# Patient Record
Sex: Female | Born: 1965
Health system: Southern US, Community
[De-identification: ages and names within clinical notes are randomized; demographics above are authoritative.]

## PROBLEM LIST (undated history)

## (undated) DIAGNOSIS — M1990 Unspecified osteoarthritis, unspecified site: Secondary | ICD-10-CM

## (undated) DIAGNOSIS — F419 Anxiety disorder, unspecified: Secondary | ICD-10-CM

## (undated) DIAGNOSIS — D649 Anemia, unspecified: Secondary | ICD-10-CM

## (undated) HISTORY — PX: ABDOMINAL HYSTERECTOMY: SHX81

## (undated) HISTORY — DX: Anxiety disorder, unspecified: F41.9

## (undated) HISTORY — DX: Anemia, unspecified: D64.9

## (undated) HISTORY — DX: Unspecified osteoarthritis, unspecified site: M19.90

## (undated) HISTORY — PX: OTHER SURGICAL HISTORY: SHX169

---

## 2001-02-15 ENCOUNTER — Other Ambulatory Visit: Admission: RE | Admit: 2001-02-15 | Discharge: 2001-02-15 | Payer: Self-pay | Admitting: Obstetrics and Gynecology

## 2001-10-19 ENCOUNTER — Encounter: Admission: RE | Admit: 2001-10-19 | Discharge: 2001-10-19 | Payer: Self-pay | Admitting: Obstetrics and Gynecology

## 2001-10-19 ENCOUNTER — Encounter: Payer: Self-pay | Admitting: Obstetrics and Gynecology

## 2003-12-12 ENCOUNTER — Encounter: Admission: RE | Admit: 2003-12-12 | Discharge: 2003-12-12 | Payer: Self-pay | Admitting: Obstetrics and Gynecology

## 2004-08-29 ENCOUNTER — Emergency Department (HOSPITAL_COMMUNITY): Admission: EM | Admit: 2004-08-29 | Discharge: 2004-08-29 | Payer: Self-pay | Admitting: Emergency Medicine

## 2004-08-29 ENCOUNTER — Ambulatory Visit: Payer: Self-pay | Admitting: Internal Medicine

## 2005-01-30 ENCOUNTER — Ambulatory Visit: Payer: Self-pay | Admitting: Internal Medicine

## 2006-07-27 ENCOUNTER — Ambulatory Visit: Payer: Self-pay | Admitting: Internal Medicine

## 2006-07-27 ENCOUNTER — Encounter: Payer: Self-pay | Admitting: Internal Medicine

## 2006-07-27 DIAGNOSIS — F411 Generalized anxiety disorder: Secondary | ICD-10-CM | POA: Insufficient documentation

## 2006-09-08 ENCOUNTER — Encounter: Payer: Self-pay | Admitting: Internal Medicine

## 2006-10-12 ENCOUNTER — Encounter (INDEPENDENT_AMBULATORY_CARE_PROVIDER_SITE_OTHER): Payer: Self-pay

## 2006-10-13 ENCOUNTER — Encounter: Payer: Self-pay | Admitting: Internal Medicine

## 2006-10-13 ENCOUNTER — Encounter (INDEPENDENT_AMBULATORY_CARE_PROVIDER_SITE_OTHER): Payer: Self-pay

## 2007-01-10 ENCOUNTER — Telehealth: Payer: Self-pay | Admitting: Internal Medicine

## 2007-01-11 ENCOUNTER — Telehealth: Payer: Self-pay | Admitting: Internal Medicine

## 2007-05-04 ENCOUNTER — Ambulatory Visit: Payer: Self-pay | Admitting: Internal Medicine

## 2007-05-04 LAB — CONVERTED CEMR LAB
ALT: 37 units/L — ABNORMAL HIGH (ref 0–35)
AST: 26 units/L (ref 0–37)
Albumin: 3.8 g/dL (ref 3.5–5.2)
Alkaline Phosphatase: 48 units/L (ref 39–117)
BUN: 7 mg/dL (ref 6–23)
Basophils Absolute: 0.1 10*3/uL (ref 0.0–0.1)
Basophils Relative: 0.8 % (ref 0.0–1.0)
Bilirubin Urine: NEGATIVE
Bilirubin, Direct: 0.1 mg/dL (ref 0.0–0.3)
CO2: 31 meq/L (ref 19–32)
Calcium: 9.4 mg/dL (ref 8.4–10.5)
Chloride: 104 meq/L (ref 96–112)
Cholesterol: 223 mg/dL (ref 0–200)
Creatinine, Ser: 0.7 mg/dL (ref 0.4–1.2)
Direct LDL: 159.7 mg/dL
Eosinophils Absolute: 0.2 10*3/uL (ref 0.0–0.6)
Eosinophils Relative: 2.5 % (ref 0.0–5.0)
GFR calc Af Amer: 119 mL/min
GFR calc non Af Amer: 99 mL/min
Glucose, Bld: 97 mg/dL (ref 70–99)
Glucose, Urine, Semiquant: NEGATIVE
HCT: 40.1 % (ref 36.0–46.0)
HDL: 48.4 mg/dL (ref >39.0)
Hemoglobin: 13.3 g/dL (ref 12.0–15.0)
Ketones, urine, test strip: NEGATIVE
Lymphocytes Relative: 32.9 % (ref 12.0–46.0)
MCHC: 33.1 g/dL (ref 30.0–36.0)
MCV: 87.2 fL (ref 78.0–100.0)
Monocytes Absolute: 0.4 10*3/uL (ref 0.2–0.7)
Monocytes Relative: 6.4 % (ref 3.0–11.0)
Neutro Abs: 4 10*3/uL (ref 1.4–7.7)
Neutrophils Relative %: 57.4 % (ref 43.0–77.0)
Nitrite: NEGATIVE
Platelets: 232 10*3/uL (ref 150–400)
Potassium: 4.7 meq/L (ref 3.5–5.1)
Protein, U semiquant: NEGATIVE
RBC: 4.59 M/uL (ref 3.87–5.11)
RDW: 12.1 % (ref 11.5–14.6)
Sodium: 140 meq/L (ref 135–145)
Specific Gravity, Urine: >=1.03
TSH: 0.89 microintl units/mL (ref 0.35–5.50)
Total Bilirubin: 0.6 mg/dL (ref 0.3–1.2)
Total CHOL/HDL Ratio: 4.6
Total Protein: 6.6 g/dL (ref 6.0–8.3)
Triglycerides: 122 mg/dL (ref 0–149)
Urobilinogen, UA: 1
VLDL: 24 mg/dL (ref 0–40)
WBC Urine, dipstick: NEGATIVE
WBC: 7 10*3/uL (ref 4.5–10.5)
pH: 6.5

## 2007-05-26 ENCOUNTER — Ambulatory Visit: Payer: Self-pay | Admitting: Internal Medicine

## 2007-05-26 DIAGNOSIS — F329 Major depressive disorder, single episode, unspecified: Secondary | ICD-10-CM | POA: Insufficient documentation

## 2007-07-05 ENCOUNTER — Telehealth: Payer: Self-pay | Admitting: Internal Medicine

## 2007-08-01 ENCOUNTER — Telehealth: Payer: Self-pay | Admitting: Internal Medicine

## 2007-09-02 ENCOUNTER — Telehealth: Payer: Self-pay | Admitting: Internal Medicine

## 2007-09-19 ENCOUNTER — Ambulatory Visit (HOSPITAL_COMMUNITY): Admission: RE | Admit: 2007-09-19 | Discharge: 2007-09-19 | Payer: Self-pay | Admitting: Internal Medicine

## 2007-10-24 ENCOUNTER — Telehealth: Payer: Self-pay | Admitting: Internal Medicine

## 2008-06-05 ENCOUNTER — Telehealth: Payer: Self-pay | Admitting: Internal Medicine

## 2009-01-18 ENCOUNTER — Emergency Department (HOSPITAL_COMMUNITY): Admission: EM | Admit: 2009-01-18 | Discharge: 2009-01-18 | Payer: Self-pay | Admitting: Family Medicine

## 2009-01-18 ENCOUNTER — Emergency Department (HOSPITAL_COMMUNITY): Admission: EM | Admit: 2009-01-18 | Discharge: 2009-01-18 | Payer: Self-pay | Admitting: Emergency Medicine

## 2009-07-23 ENCOUNTER — Telehealth (INDEPENDENT_AMBULATORY_CARE_PROVIDER_SITE_OTHER): Payer: Self-pay | Admitting: *Deleted

## 2009-07-24 ENCOUNTER — Ambulatory Visit: Payer: Self-pay | Admitting: Internal Medicine

## 2009-08-19 ENCOUNTER — Telehealth: Payer: Self-pay | Admitting: Internal Medicine

## 2009-09-03 ENCOUNTER — Ambulatory Visit: Payer: Self-pay | Admitting: Internal Medicine

## 2009-09-03 LAB — CONVERTED CEMR LAB
ALT: 21 units/L (ref 0–35)
AST: 18 units/L (ref 0–37)
Albumin: 4.1 g/dL (ref 3.5–5.2)
Alkaline Phosphatase: 46 units/L (ref 39–117)
BUN: 11 mg/dL (ref 6–23)
Basophils Absolute: 0 10*3/uL (ref 0.0–0.1)
Basophils Relative: 0.3 % (ref 0.0–3.0)
Bilirubin Urine: NEGATIVE
Bilirubin, Direct: 0.1 mg/dL (ref 0.0–0.3)
CO2: 28 meq/L (ref 19–32)
Calcium: 9.4 mg/dL (ref 8.4–10.5)
Chloride: 107 meq/L (ref 96–112)
Cholesterol: 222 mg/dL — ABNORMAL HIGH (ref 0–200)
Creatinine, Ser: 0.8 mg/dL (ref 0.4–1.2)
Direct LDL: 160.4 mg/dL
Eosinophils Absolute: 0.1 10*3/uL (ref 0.0–0.7)
Eosinophils Relative: 1.7 % (ref 0.0–5.0)
GFR calc non Af Amer: 84.67 mL/min (ref >60)
Glucose, Bld: 97 mg/dL (ref 70–99)
Glucose, Urine, Semiquant: NEGATIVE
HCT: 41 % (ref 36.0–46.0)
HDL: 55.3 mg/dL (ref >39.00)
Hemoglobin: 14 g/dL (ref 12.0–15.0)
Ketones, urine, test strip: NEGATIVE
Lymphocytes Relative: 29.5 % (ref 12.0–46.0)
Lymphs Abs: 2.1 10*3/uL (ref 0.7–4.0)
MCHC: 34.1 g/dL (ref 30.0–36.0)
MCV: 88.6 fL (ref 78.0–100.0)
Monocytes Absolute: 0.5 10*3/uL (ref 0.1–1.0)
Monocytes Relative: 6.9 % (ref 3.0–12.0)
Neutro Abs: 4.3 10*3/uL (ref 1.4–7.7)
Neutrophils Relative %: 61.6 % (ref 43.0–77.0)
Nitrite: NEGATIVE
Platelets: 228 10*3/uL (ref 150.0–400.0)
Potassium: 5.2 meq/L — ABNORMAL HIGH (ref 3.5–5.1)
Protein, U semiquant: NEGATIVE
RBC: 4.62 M/uL (ref 3.87–5.11)
RDW: 12.9 % (ref 11.5–14.6)
Sodium: 142 meq/L (ref 135–145)
Specific Gravity, Urine: 1.025
TSH: 0.9 microintl units/mL (ref 0.35–5.50)
Total Bilirubin: 0.7 mg/dL (ref 0.3–1.2)
Total CHOL/HDL Ratio: 4
Total Protein: 7.1 g/dL (ref 6.0–8.3)
Triglycerides: 96 mg/dL (ref 0.0–149.0)
Urobilinogen, UA: 0.2
VLDL: 19.2 mg/dL (ref 0.0–40.0)
WBC Urine, dipstick: NEGATIVE
WBC: 7.1 10*3/uL (ref 4.5–10.5)
pH: 6

## 2009-09-11 ENCOUNTER — Telehealth: Payer: Self-pay | Admitting: Internal Medicine

## 2009-09-18 ENCOUNTER — Ambulatory Visit: Payer: Self-pay | Admitting: Internal Medicine

## 2009-09-19 ENCOUNTER — Telehealth: Payer: Self-pay | Admitting: Internal Medicine

## 2010-03-27 NOTE — Progress Notes (Signed)
Summary: Call-A-Nurse Report    Call-A-Nurse Triage Call Report Triage Record Num: [id number] Operator: Jaci Carrel Patient Name: Michaela Gutierrez Call Date & Time: 07/22/2009 8:12:10AM Patient Phone: [phone] PCP: Patient Gender: Female PCP Fax : Patient DOB: 11/03/1965 Practice Name: [facility] Reason for Call: Pt calling with "panic attack." She has spoke to [facility] and she needs refill for Ambien and Ativan. Sx: heart racing, stomachache- onset 5.30 early am. Pt stopped taking Wellbutrin approx. 2 weeks ago- she is trying to "wean off" medication. She would like Ativan called in if possible. Advised pt to call office in am for refill request- advised eval today per disposition. Protocol(s) Used: Anxiety Recommended Outcome per Protocol: See Provider within 72 Hours Reason for Outcome: New or increasing symptoms AND not currently in treatment; not taking medications/therapy; or change in medication or therapy Care Advice: Continue to follow your treatment plan. Take all medications as prescribed and keep all appointments with your provider.  ~ Avoid the use of stimulants including caffeine (coffee, some soft drinks, some energy drinks, tea and chocolate), cocaine, and amphetamines. Also avoid drinking alcohol.  ~ 07/22/2009 8:25:46AM Page 1 of 1 [id number]

## 2010-03-27 NOTE — Progress Notes (Signed)
Summary: refill lorazopam  Phone Note Refill Request Message from:  Fax from Pharmacy on September 11, 2009 11:10 AM  Refills Requested: Medication #1:  LORAZEPAM 0.5 MG TABS Take 1 tablet by mouth as directed   Last Ref[facility]: [facility]/2009 [facility]    [phone]   Method Requested: Fax to Local Pharmacy Initial call taken by: Duard Brady LPN,  September 11, 2009 11:11 AM  Follow-up for Phone Call        #50  RF 2 Follow-up by: Gordy Savers  MD,  September 11, 2009 12:12 PM    Prescriptions: LORAZEPAM 0.5 MG TABS (LORAZEPAM) Take 1 tablet by mouth as directed  #50 x 2   Entered by:   Duard Brady LPN   Authorized by:   Gordy Savers  MD   Signed by:   Duard Brady LPN on 45/40/9811   Method used:   Historical   RxID:   [id number]

## 2010-03-27 NOTE — Assessment & Plan Note (Signed)
Summary: PANIC ATTACKS//CCM   Vital Signs:  Patient profile:   45 year old female Weight:      185 pounds Temp:     98.1 degrees F oral BP sitting:   120 / 80  (left arm) Cuff size:   regular  Vitals Entered By: Duard Brady LPN (July 25, 2438 8:03 AM) CC: c/o panic attacks - medication review Is Patient Diabetic? No   CC:  c/o panic attacks - medication review.  History of Present Illness: 45 year old patient who has a history of a panic disorder.  She self titrated herself off Lexapro 6 months ago.  More recently, she and self discontinued Wellbutrin, and has been off this medication for a number of weeks.  Over the weekend.  She had a severe panic attack.  She also describes a general sense of anxiety since being off   the Wellbutrin.  She states that when she was on 300 mg of Wellbutrin without Lexapro she required Ambien 4 nights out of 7 for insomnia issues.  When she wastreated with  Lexapro alone.  She felt somnolent and chronically tired  Preventive Screening-Counseling & Management  Alcohol-Tobacco     Smoking Status: never  Allergies (verified): No Known Drug Allergies  Past History:  Past Medical History: Anxiety/ panic  disorder gravida 4, para two, abortus two Depression  Physical Exam  General:  overweight-appearing.  120/80 Psych:  Cognition and judgment appear intact. Alert and cooperative with normal attention span and concentration. No apparent delusions, illusions, hallucinations   Impression & Recommendations:  Problem # 1:  ANXIETY (ICD-300.00)  The following medications were removed from the medication list:    Lexapro 10 Mg Tabs (Escitalopram oxalate) .Marland Kitchen... Take 1 tablet by mouth once a day    Lorazepam 0.5 Mg Tabs (Lorazepam) .Marland Kitchen... 1 two times a day as needed    Budeprion Xl 150 Mg Tb24 (Bupropion hcl) .Marland Kitchen... 2 every am Her updated medication list for this problem includes:    Lorazepam 0.5 Mg Tabs (Lorazepam) .Marland Kitchen... Take 1 tablet by  mouth as directed    Budeprion Xl 150 Mg Xr24h-tab (Bupropion hcl) ..... One daily  Complete Medication List: 1)  Ambien 10 Mg Tabs (Zolpidem tartrate) .... Take 1 tablet by mouth at bedtime 2)  Lorazepam 0.5 Mg Tabs (Lorazepam) .... Take 1 tablet by mouth as directed 3)  Budeprion Xl 150 Mg Xr24h-tab (Bupropion hcl) .... One daily  Patient Instructions: 1)  Please schedule a follow-up appointment in 3 months. 2)  It is important that you exercise regularly at least 20 minutes 5 times a week. If you develop chest pain, have severe difficulty breathing, or feel very tired , stop exercising immediately and seek medical attention. 3)  You need to lose weight. Consider a lower calorie diet and regular exercise.  Prescriptions: BUDEPRION XL 150 MG XR24H-TAB (BUPROPION HCL) one daily  #90 x 3   Entered and Authorized by:   Gordy Savers  MD   Signed by:   Gordy Savers  MD on 07/24/2009   Method used:   Print then Give to Patient   RxID:   [id number] LORAZEPAM 0.5 MG TABS (LORAZEPAM) Take 1 tablet by mouth as directed  #50 x 1   Entered and Authorized by:   Gordy Savers  MD   Signed by:   Gordy Savers  MD on 07/24/2009   Method used:   Print then Give to Patient   RxID:   [id number]  AMBIEN 10 MG TABS (ZOLPIDEM TARTRATE) Take 1 tablet by mouth at bedtime  #30 x 1   Entered and Authorized by:   Gordy Savers  MD   Signed by:   Gordy Savers  MD on 07/24/2009   Method used:   Print then Give to Patient   RxID:   [id number]

## 2010-03-27 NOTE — Progress Notes (Signed)
Summary: refill zolpidem  Phone Note Refill Request Message from:  Fax from Pharmacy on September 19, 2009 4:45 PM  Refills Requested: Medication #1:  AMBIEN 10 MG TABS Take 1 tablet by mouth at bedtime   Last Ref[facility]: [facility]/2009 [facility]    [phone]   Method Requested: Telephone to Pharmacy Initial call taken by: Duard Brady LPN,  September 19, 2009 4:45 PM  Follow-up for Phone Call        [facility] Follow-up by: Duard Brady LPN,  September 20, 2009 8:31 AM    Prescriptions: AMBIEN 10 MG TABS (ZOLPIDEM TARTRATE) Take 1 tablet by mouth at bedtime  #30 x 3   Entered by:   Duard Brady LPN   Authorized by:   Gordy Savers  MD   Signed by:   Duard Brady LPN on 11/91/4782   Method used:   Historical   RxID:   [id number]

## 2010-03-27 NOTE — Progress Notes (Signed)
Summary: wellbutrin and alprazolam  Phone Note Call from Patient Call back at [phone]   Caller: Patient Call For: Gordy Savers  MD Summary of Call: pt  was start on wellbutrin 150 xl on 07-24-2009 still having anxiety issue should she increase or add lexapro [facility] [phone]. Initial call taken by: Heron Sabins,  August 19, 2009 11:05 AM  Follow-up for Phone Call        increase welbutrin 150  to two daily;  Rx alpraxolam 0.5  #50 one twice  as needed Follow-up by: Gordy Savers  MD,  August 19, 2009 12:45 PM    New/Updated Medications: BUDEPRION XL 150 MG XR24H-TAB (BUPROPION HCL) bid ALPRAZOLAM 0.5 MG TABS (ALPRAZOLAM) 1 by mouth two times a day prn Prescriptions: ALPRAZOLAM 0.5 MG TABS (ALPRAZOLAM) 1 by mouth two times a day prn  #50 x 0   Entered by:   Duard Brady LPN   Authorized by:   Gordy Savers  MD   Signed by:   Duard Brady LPN on 91/47/8295   Method used:   Historical   RxID:   [id number] BUDEPRION XL 150 MG XR24H-TAB (BUPROPION HCL) bid  #60 x 6   Entered by:   Duard Brady LPN   Authorized by:   Gordy Savers  MD   Signed by:   Duard Brady LPN on 52/84/1324   Method used:   Historical   RxID:   [id number]

## 2010-03-27 NOTE — Assessment & Plan Note (Signed)
Summary: CPX/RCD   Vital Signs:  Patient profile:   45 year old female Height:      64.14 inches Weight:      180 pounds BMI:     30.87 Temp:     98.4 degrees F oral BP sitting:   118 / 80  (right arm) Cuff size:   regular  Vitals Entered By: Duard Brady LPN (September 18, 2009 8:38 AM) CC: cpx - doing ok Is Patient Diabetic? No   CC:  cpx - doing ok.  History of Present Illness: 45 year old patient who is seen today for a well exam.  She is doing quite well.  She has a history of panic disorder, anxiety, depression, and is scheduled for evaluation with [facility] later today. she is doing much better since resuming Wellbutrin.  She had been on Lexapro in the past, but when treated with a single agent had  considerable fatigue and weakness.  she is followed by gynecology every 3 years.  She is status post partial hysterectomy.  Preventive Screening-Counseling & Management  Alcohol-Tobacco     Smoking Status: never  Allergies (verified): No Known Drug Allergies  Past History:  Past Medical History: Reviewed history from 07/24/2009 and no changes required. Anxiety/ panic  disorder gravida 4, para two, abortus two Depression  Past Surgical History: Reviewed history from 05/26/2007 and no changes required. Caesarean section x 2 Hysterectomy  partial 1995.    Family History: Reviewed history from 05/26/2007 and no changes required. father is 48, history of hypertension, hypercholesterolemia mother, age 58.  Hypertension, obesity  Paternal grandfatherr lung cancer and prostate cancer paternal grandmother, single dementia of the Alzheimer's type maternal grandmother, osteoarthritis macular degeneration  Two aunts with breast cancer one brother is well  Social History: Reviewed history from 05/26/2007 and no changes required. two sons Occupation:  Charity fundraiser Married  Review of Systems       The patient complains of depression.  The patient denies anorexia,  fever, weight loss, weight gain, vision loss, decreased hearing, hoarseness, chest pain, syncope, dyspnea on exertion, peripheral edema, prolonged cough, headaches, hemoptysis, abdominal pain, melena, hematochezia, severe indigestion/heartburn, hematuria, incontinence, genital sores, muscle weakness, suspicious skin lesions, transient blindness, difficulty walking, unusual weight change, abnormal bleeding, enlarged lymph nodes, angioedema, and breast masses.    Physical Exam  General:  overweight-appearing.  low-normal blood pressureoverweight-appearing.   Head:  Normocephalic and atraumatic without obvious abnormalities. No apparent alopecia or balding. Eyes:  No corneal or conjunctival inflammation noted. EOMI. Perrla. Funduscopic exam benign, without hemorrhages, exudates or papilledema. Vision grossly normal. Ears:  External ear exam shows no significant lesions or deformities.  Otoscopic examination reveals clear canals, tympanic membranes are intact bilaterally without bulging, retraction, inflammation or discharge. Hearing is grossly normal bilaterally. Mouth:  Oral mucosa and oropharynx without lesions or exudates.  Teeth in good repair. Neck:  No deformities, masses, or tenderness noted. Chest Wall:  No deformities, masses, or tenderness noted. Lungs:  Normal respiratory effort, chest expands symmetrically. Lungs are clear to auscultation, no crackles or wheezes. Heart:  Normal rate and regular rhythm. S1 and S2 normal without gallop, murmur, click, rub or other extra sounds. Abdomen:  Bowel sounds positive,abdomen soft and non-tender without masses, organomegaly or hernias noted. Msk:  No deformity or scoliosis noted of thoracic or lumbar spine.   Pulses:  R and L carotid,radial,femoral,dorsalis pedis and posterior tibial pulses are full and equal bilaterally Extremities:  No clubbing, cyanosis, edema, or deformity noted with normal full  range of motion of all joints.   Neurologic:  No  cranial nerve deficits noted. Station and gait are normal. Plantar reflexes are down-going bilaterally. DTRs are symmetrical throughout. Sensory, motor and coordinative functions appear intact. Skin:  Intact without suspicious lesions or rashes Cervical Nodes:  No lymphadenopathy noted Axillary Nodes:  No palpable lymphadenopathy Inguinal Nodes:  No significant adenopathy Psych:  Cognition and judgment appear intact. Alert and cooperative with normal attention span and concentration. No apparent delusions, illusions, hallucinations   Impression & Recommendations:  Problem # 1:  PHYSICAL EXAMINATION (ICD-V70.0)  Complete Medication List: 1)  Ambien 10 Mg Tabs (Zolpidem tartrate) .... Take 1 tablet by mouth at bedtime 2)  Lorazepam 0.5 Mg Tabs (Lorazepam) .... Take 1 tablet by mouth as directed 3)  Budeprion Xl 150 Mg Xr24h-tab (Bupropion hcl) .... Bid 4)  Alprazolam 0.5 Mg Tabs (Alprazolam) .Marland Kitchen.. 1 by mouth two times a day prn  Patient Instructions: 1)  It is important that you exercise regularly at least 20 minutes 5 times a week. If you develop chest pain, have severe difficulty breathing, or feel very tired , stop exercising immediately and seek medical attention. 2)  You need to lose weight. Consider a lower calorie diet and regular exercise.

## 2010-05-28 LAB — CBC
HCT: 40.2 % (ref 36.0–46.0)
Hemoglobin: 13.9 g/dL (ref 12.0–15.0)
MCHC: 34.7 g/dL (ref 30.0–36.0)
MCV: 87.2 fL (ref 78.0–100.0)
Platelets: 223 10*3/uL (ref 150–400)
RBC: 4.61 MIL/uL (ref 3.87–5.11)
RDW: 12.6 % (ref 11.5–15.5)
WBC: 8.1 10*3/uL (ref 4.0–10.5)

## 2010-05-28 LAB — DIFFERENTIAL
Basophils Absolute: 0 10*3/uL (ref 0.0–0.1)
Basophils Relative: 0 % (ref 0–1)
Eosinophils Absolute: 0.2 10*3/uL (ref 0.0–0.7)
Eosinophils Relative: 2 % (ref 0–5)
Lymphocytes Relative: 33 % (ref 12–46)
Lymphs Abs: 2.6 10*3/uL (ref 0.7–4.0)
Monocytes Absolute: 0.5 10*3/uL (ref 0.1–1.0)
Monocytes Relative: 6 % (ref 3–12)
Neutro Abs: 4.7 10*3/uL (ref 1.7–7.7)
Neutrophils Relative %: 59 % (ref 43–77)

## 2010-05-28 LAB — CULTURE, ROUTINE-ABSCESS: Culture: NO GROWTH

## 2010-09-20 ENCOUNTER — Other Ambulatory Visit: Payer: Self-pay | Admitting: Internal Medicine

## 2010-12-13 ENCOUNTER — Other Ambulatory Visit: Payer: Self-pay | Admitting: Internal Medicine

## 2011-02-18 ENCOUNTER — Other Ambulatory Visit: Payer: Self-pay | Admitting: Internal Medicine

## 2011-04-05 ENCOUNTER — Other Ambulatory Visit: Payer: Self-pay | Admitting: Internal Medicine

## 2012-06-13 ENCOUNTER — Other Ambulatory Visit: Payer: Self-pay | Admitting: Internal Medicine

## 2012-06-13 DIAGNOSIS — Z1231 Encounter for screening mammogram for malignant neoplasm of breast: Secondary | ICD-10-CM

## 2012-06-15 ENCOUNTER — Ambulatory Visit (HOSPITAL_COMMUNITY): Payer: Self-pay

## 2012-06-16 ENCOUNTER — Ambulatory Visit (HOSPITAL_COMMUNITY)
Admission: RE | Admit: 2012-06-16 | Discharge: 2012-06-16 | Disposition: A | Discharge status: Home / Self Care | Payer: BC Managed Care – PPO | Source: Ambulatory Visit | Attending: Internal Medicine | Admitting: Internal Medicine

## 2012-06-16 DIAGNOSIS — Z1231 Encounter for screening mammogram for malignant neoplasm of breast: Secondary | ICD-10-CM | POA: Insufficient documentation

## 2012-06-17 ENCOUNTER — Other Ambulatory Visit: Payer: Self-pay | Admitting: Internal Medicine

## 2012-06-17 DIAGNOSIS — R928 Other abnormal and inconclusive findings on diagnostic imaging of breast: Secondary | ICD-10-CM

## 2012-06-29 ENCOUNTER — Ambulatory Visit
Admission: RE | Admit: 2012-06-29 | Discharge: 2012-06-29 | Disposition: A | Discharge status: Home / Self Care | Payer: BC Managed Care – PPO | Source: Ambulatory Visit | Attending: Internal Medicine | Admitting: Internal Medicine

## 2012-06-29 DIAGNOSIS — R928 Other abnormal and inconclusive findings on diagnostic imaging of breast: Secondary | ICD-10-CM

## 2012-10-27 ENCOUNTER — Encounter: Payer: Self-pay | Admitting: Internal Medicine

## 2012-10-27 ENCOUNTER — Ambulatory Visit (INDEPENDENT_AMBULATORY_CARE_PROVIDER_SITE_OTHER): Payer: BC Managed Care – PPO | Admitting: Internal Medicine

## 2012-10-27 VITALS — BP 160/90 | HR 110 | Temp 98.1°F | Resp 20 | Ht 63.75 in | Wt 201.0 lb

## 2012-10-27 DIAGNOSIS — M1990 Unspecified osteoarthritis, unspecified site: Secondary | ICD-10-CM

## 2012-10-27 DIAGNOSIS — M151 Heberden's nodes (with arthropathy): Secondary | ICD-10-CM | POA: Insufficient documentation

## 2012-10-27 DIAGNOSIS — M159 Polyosteoarthritis, unspecified: Secondary | ICD-10-CM

## 2012-10-27 DIAGNOSIS — Z0000 Encounter for general adult medical examination without abnormal findings: Secondary | ICD-10-CM

## 2012-10-27 MED ORDER — MELOXICAM 15 MG PO TABS: 15.0000 mg | ORAL_TABLET | Freq: Every day | ORAL | Status: DC

## 2012-10-27 NOTE — Progress Notes (Signed)
Subjective:    Patient ID: Michaela Gutierrez, female    DOB: 1965/08/21, 47 y.o.   MRN: [id number]  HPI  47 year old patient who is seen today to reestablish with our practice. 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 Her chief complaint today or painful nodules involving the PIP joints of both hands. Her mother had back osteoarthritis in her maternal  grandmother apparently had prominent Heberden's nodes. She has a history of anxiety depression and is being followed by psychiatry and apparently doing quite well.   she's had a remote hysterectomy and is complaining of some hot flashes and lack of interest in sex and is concerned about early menopause. Her mother apparently had menopause at age 74-50   Father has a history of coronary artery disease. Paternal grandfather had colon cancer Mother has osteoarthritis maternal grandmother age 45  Social history RN works at [employer] 2 sons age 22 and 75 History reviewed. No pertinent past medical history.  History   Social History  . Marital Status: Married    Spouse Name: N/A    Number of Children: N/A  . Years of Education: N/A   Occupational History  . Not on file.   Social History Main Topics  . Smoking status: Former Smoker    Types: Cigarettes  . Smokeless tobacco: Never Used  . Alcohol Use: 0.5 oz/week    1 drink(s) per week  . Drug Use: No  . Sexual Activity: Not on file   Other Topics Concern  . Not on file   Social History Narrative  . No narrative on file    History reviewed. No pertinent past surgical history.  No family history on file.  No Known Allergies  Current Outpatient Prescriptions on File Prior to Visit  Medication Sig Dispense Refill  . buPROPion (WELLBUTRIN SR) 150 MG 12 hr tablet TAKE 1 TABLET BY MOUTH TWICE A DAY  60 tablet  0   No current facility-administered medications on file prior to visit.    BP 160/90  Pulse 110  Temp(Src) 98.1 F (36.7 C) (Oral)  Resp 20  Ht 5' 3.75" (1.619  m)  Wt 201 lb (91.173 kg)  BMI 34.78 kg/m2  SpO2 95%      Review of Systems  Constitutional: Negative.   HENT: Negative for hearing loss, congestion, sore throat, rhinorrhea, dental problem, sinus pressure and tinnitus.   Eyes: Negative for pain, discharge and visual disturbance.  Respiratory: Negative for cough and shortness of breath.   Cardiovascular: Negative for chest pain, palpitations and leg swelling.  Gastrointestinal: Negative for nausea, vomiting, abdominal pain, diarrhea, constipation, blood in stool and abdominal distention.  Genitourinary: Negative for dysuria, urgency, frequency, hematuria, flank pain, vaginal bleeding, vaginal discharge, difficulty urinating, vaginal pain and pelvic pain.  Musculoskeletal: Positive for joint swelling. Negative for arthralgias and gait problem.  Skin: Negative for rash.  Neurological: Negative for dizziness, syncope, speech difficulty, weakness, numbness and headaches.  Hematological: Negative for adenopathy.  Psychiatric/Behavioral: Positive for dysphoric mood. Negative for behavioral problems and agitation. The patient is not nervous/anxious.        Objective:   Physical Exam  Constitutional: She is oriented to person, place, and time. She appears well-developed and well-nourished.  Weight 201  HENT:  Head: Normocephalic.  Right Ear: External ear normal.  Left Ear: External ear normal.  Mouth/Throat: Oropharynx is clear and moist.  Eyes: Conjunctivae and EOM are normal. Pupils are equal, round, and reactive to light.  Neck: Normal range  of motion. Neck supple. No thyromegaly present.  Cardiovascular: Normal rate, regular rhythm, normal heart sounds and intact distal pulses.   Pulmonary/Chest: Effort normal and breath sounds normal.  Abdominal: Soft. Bowel sounds are normal. She exhibits no mass. There is no tenderness.  Musculoskeletal: Normal range of motion.  Heberden's nodes involving the fingers 2 through 5 on the right  hand fingers 2 and 3 on the left. No thumb involvement  Lymphadenopathy:    She has no cervical adenopathy.  Neurological: She is alert and oriented to person, place, and time.  Skin: Skin is warm and dry. No rash noted.  Psychiatric: She has a normal mood and affect. Her behavior is normal.          Assessment & Plan:  Preventive health exam Prominent Heberden's nodes. Will treat with Tylenol as needed and give a prescription for meloxicam as needed  Laboratory update including [facility]

## 2012-10-27 NOTE — Patient Instructions (Addendum)
It is important that you exercise regularly, at least 20 minutes 3 to 4 times per week.  If you develop chest pain or shortness of breath seek  medical attention.  You need to lose weight.  Consider a lower calorie diet and regular exercise.  Tylenol 650 mg 3 times daily  Meloxicam  15  mg once daily as neededOsteoarthritis Osteoarthritis is the most common form of arthritis. It is redness, soreness, and swelling (inflammation) affecting the cartilage. Cartilage acts as a cushion, covering the ends of bones where they meet to form a joint. CAUSES  Over time, the cartilage begins to wear away. This causes bone to rub on bone. This produces pain and stiffness in the affected joints. Factors that contribute to this problem are:  Excessive body weight.  Age.  Overuse of joints. SYMPTOMS   People with osteoarthritis usually experience joint pain, swelling, or stiffness.  Over time, the joint may lose its normal shape.  Small deposits of bone (osteophytes) may grow on the edges of the joint.  Bits of bone or cartilage can break off and float inside the joint space. This may cause more pain and damage.  Osteoarthritis can lead to depression, anxiety, feelings of helplessness, and limitations on daily activities. The most commonly affected joints are in the:  Ends of the fingers.  Thumbs.  Neck.  Lower back.  Knees.  Hips. DIAGNOSIS  Diagnosis is mostly based on your symptoms and exam. Tests may be helpful, including:  X-rays of the affected joint.  A computerized magnetic scan (MRI).  Blood tests to rule out other types of arthritis.  Joint fluid tests. This involves using a needle to draw fluid from the joint and examining the fluid under a microscope. TREATMENT  Goals of treatment are to control pain, improve joint function, maintain a normal body weight, and maintain a healthy lifestyle. Treatment approaches may include:  A prescribed exercise program with rest and  joint relief.  Weight control with nutritional education.  Pain relief techniques such as:  Properly applied heat and cold.  Electric pulses delivered to nerve endings under the skin (transcutaneous electrical nerve stimulation, TENS).  Massage.  Certain supplements. Ask your caregiver before using any supplements, especially in combination with prescribed drugs.  Medicines to control pain, such as:  Acetaminophen.  Nonsteroidal anti-inflammatory drugs (NSAIDs), such as naproxen.  Narcotic or central-acting agents, such as tramadol. This drug carries a risk of addiction and is generally prescribed for short-term use.  Corticosteroids. These can be given orally or as injection. This is a short-term treatment, not recommended for routine use.  Surgery to reposition the bones and relieve pain (osteotomy) or to remove loose pieces of bone and cartilage. Joint replacement may be needed in advanced states of osteoarthritis. HOME CARE INSTRUCTIONS  Your caregiver can recommend specific types of exercise. These may include:  Strengthening exercises. These are done to strengthen the muscles that support joints affected by arthritis. They can be performed with weights or with exercise bands to add resistance.  Aerobic activities. These are exercises, such as brisk walking or low-impact aerobics, that get your heart pumping. They can help keep your lungs and circulatory system in shape.  Range-of-motion activities. These keep your joints limber.  Balance and agility exercises. These help you maintain daily living skills. Learning about your condition and being actively involved in your care will help improve the course of your osteoarthritis. SEEK MEDICAL CARE IF:   You feel hot or your skin turns  red.  You develop a rash in addition to your joint pain.  You have an oral temperature above 102 F (38.9 C). FOR MORE INFORMATION  National Institute of Arthritis and Musculoskeletal and  Skin Diseases: www.niams.http://www.myers.net/ [employer] on Aging: https://walker.com/ American College of Rheumatology: www.rheumatology.org Document Released: 02/09/2005 Document Revised: 05/04/2011 Document Reviewed: 05/23/2009 [facility] Patient Information 2014 [place], [place].

## 2012-10-28 ENCOUNTER — Other Ambulatory Visit: Payer: BC Managed Care – PPO

## 2012-12-29 ENCOUNTER — Other Ambulatory Visit: Payer: Self-pay

## 2013-12-08 ENCOUNTER — Other Ambulatory Visit: Payer: Self-pay

## 2015-02-07 ENCOUNTER — Other Ambulatory Visit: Payer: Self-pay

## 2015-02-07 DIAGNOSIS — Z1231 Encounter for screening mammogram for malignant neoplasm of breast: Secondary | ICD-10-CM

## 2015-02-08 ENCOUNTER — Ambulatory Visit (INDEPENDENT_AMBULATORY_CARE_PROVIDER_SITE_OTHER): Payer: BLUE CROSS/BLUE SHIELD | Admitting: Internal Medicine

## 2015-02-08 ENCOUNTER — Encounter: Payer: Self-pay | Admitting: Internal Medicine

## 2015-02-08 VITALS — BP 102/70 | HR 92 | Temp 98.3°F | Ht 63.75 in | Wt 200.0 lb

## 2015-02-08 DIAGNOSIS — M150 Primary generalized (osteo)arthritis: Secondary | ICD-10-CM

## 2015-02-08 DIAGNOSIS — M151 Heberden's nodes (with arthropathy): Secondary | ICD-10-CM

## 2015-02-08 DIAGNOSIS — M159 Polyosteoarthritis, unspecified: Secondary | ICD-10-CM

## 2015-02-08 MED ORDER — MELOXICAM 15 MG PO TABS: 15.0000 mg | ORAL_TABLET | Freq: Every day | ORAL | Status: DC

## 2015-02-08 NOTE — Patient Instructions (Signed)
Rheumatology follow-up as discussed  Osteoarthritis Osteoarthritis is a disease that causes soreness and inflammation of a joint. It occurs when the cartilage at the affected joint wears down. Cartilage acts as a cushion, covering the ends of bones where they meet to form a joint. Osteoarthritis is the most common form of arthritis. It often occurs in older people. The joints affected most often by this condition include those in the:  Ends of the fingers.  Thumbs.  Neck.  Lower back.  Knees.  Hips. CAUSES  Over time, the cartilage that covers the ends of bones begins to wear away. This causes bone to rub on bone, producing pain and stiffness in the affected joints.  RISK FACTORS Certain factors can increase your chances of having osteoarthritis, including:  Older age.  Excessive body weight.  Overuse of joints.  Previous joint injury. SIGNS AND SYMPTOMS   Pain, swelling, and stiffness in the joint.  Over time, the joint may lose its normal shape.  Small deposits of bone (osteophytes) may grow on the edges of the joint.  Bits of bone or cartilage can break off and float inside the joint space. This may cause more pain and damage. DIAGNOSIS  Your health care provider will do a physical exam and ask about your symptoms. Various tests may be ordered, such as:  X-rays of the affected joint.  Blood tests to rule out other types of arthritis. Additional tests may be used to diagnose your condition. TREATMENT  Goals of treatment are to control pain and improve joint function. Treatment plans may include:  A prescribed exercise program that allows for rest and joint relief.  A weight control plan.  Pain relief techniques, such as:  Properly applied heat and cold.  Electric pulses delivered to nerve endings under the skin (transcutaneous electrical nerve stimulation [TENS]).  Massage.  Certain nutritional supplements.  Medicines to control pain, such  as:  Acetaminophen.  Nonsteroidal anti-inflammatory drugs (NSAIDs), such as naproxen.  Narcotic or central-acting agents, such as tramadol.  Corticosteroids. These can be given orally or as an injection.  Surgery to reposition the bones and relieve pain (osteotomy) or to remove loose pieces of bone and cartilage. Joint replacement may be needed in advanced states of osteoarthritis. HOME CARE INSTRUCTIONS   Take medicines only as directed by your health care provider.  Maintain a healthy weight. Follow your health care provider's instructions for weight control. This may include dietary instructions.  Exercise as directed. Your health care provider can recommend specific types of exercise. These may include:  Strengthening exercises. These are done to strengthen the muscles that support joints affected by arthritis. They can be performed with weights or with exercise bands to add resistance.  Aerobic activities. These are exercises, such as brisk walking or low-impact aerobics, that get your heart pumping.  Range-of-motion activities. These keep your joints limber.  Balance and agility exercises. These help you maintain daily living skills.  Rest your affected joints as directed by your health care provider.  Keep all follow-up visits as directed by your health care provider. SEEK MEDICAL CARE IF:   Your skin turns red.  You develop a rash in addition to your joint pain.  You have worsening joint pain.  You have a fever along with joint or muscle aches. SEEK IMMEDIATE MEDICAL CARE IF:  You have a significant loss of weight or appetite.  You have night sweats. [place] of Arthritis and Musculoskeletal and Skin Diseases:  www.niams.SouthExposed.es  [employer] on Aging: http://kim-miller.com/  American College of Rheumatology: www.rheumatology.org   This information is not intended to replace advice given to you by your health care provider.  Make sure you discuss any questions you have with your health care provider.   Document Released: 02/09/2005 Document Revised: 03/02/2014 Document Reviewed: 10/17/2012 Elsevier Interactive Patient Education [employer].

## 2015-02-08 NOTE — Progress Notes (Signed)
Pre visit review using our clinic review tool, if applicable. No additional management support is needed unless otherwise documented below in the visit note. 

## 2015-02-08 NOTE — Progress Notes (Signed)
   Subjective:    Patient ID: Michaela Gutierrez, female    DOB: [birth year], 49 y.o.   MRN: [id number]  HPI  49 year old patient who has a long history of osteoarthritis with prominent Heberden's nodes.  Her grandmother had significant hand osteoarthritic disease.  The patient has now developed some PIP joint involving especially the right fourth digit.  She states that she had to recently cut her wedding band from her left fourth finger due to swelling.  She takes Advil periodically.  She has used meloxicam in the past.   No past medical history on file.  Social History   Social History  . Marital Status: Married    Spouse Name: N/A  . Number of Children: N/A  . Years of Education: N/A   Occupational History  . Not on file.   Social History Main Topics  . Smoking status: Former Smoker    Types: Cigarettes  . Smokeless tobacco: Never Used  . Alcohol Use: 0.5 oz/week    1 drink(s) per week  . Drug Use: No  . Sexual Activity: Not on file   Other Topics Concern  . Not on file   Social History Narrative    No past surgical history on file.  No family history on file.  No Known Allergies  Current Outpatient Prescriptions on File Prior to Visit  Medication Sig Dispense Refill  . buPROPion (WELLBUTRIN SR) 150 MG 12 hr tablet TAKE 1 TABLET BY MOUTH TWICE A DAY 60 tablet 0  . sertraline (ZOLOFT) 100 MG tablet Take 100 mg by mouth daily.    Marland Kitchen zolpidem (AMBIEN) 10 MG tablet Take 10 mg by mouth at bedtime as needed for sleep.     No current facility-administered medications on file prior to visit.    BP 102/70 mmHg  Pulse 92  Temp(Src) 98.3 F (36.8 C) (Oral)  Ht 5' 3.75" (1.619 m)  Wt 200 lb (90.719 kg)  BMI 34.61 kg/m2  SpO2 98%     Review of Systems  Musculoskeletal: Positive for joint swelling and arthralgias.       Objective:   Physical Exam  Constitutional: She appears well-developed and well-nourished. No distress.  Musculoskeletal:  Prominent Heberden's  nodes Soft tissue swelling of the right fourth PIP joint          Assessment & Plan:   Symptomatic osteoarthritis.  Patient is requesting rheumatology evaluation.  Will schedule.  Will refill meloxicam Patient information dispensed  Return here when necessary

## 2015-02-15 ENCOUNTER — Ambulatory Visit: Payer: Self-pay

## 2015-10-04 ENCOUNTER — Other Ambulatory Visit: Payer: Self-pay | Admitting: Internal Medicine

## 2016-05-26 DIAGNOSIS — F410 Panic disorder [episodic paroxysmal anxiety] without agoraphobia: Secondary | ICD-10-CM | POA: Diagnosis not present

## 2016-06-12 ENCOUNTER — Other Ambulatory Visit: Payer: Self-pay | Admitting: Internal Medicine

## 2016-08-31 ENCOUNTER — Telehealth: Payer: Self-pay | Admitting: Internal Medicine

## 2016-08-31 NOTE — Telephone Encounter (Signed)
referral ordered 

## 2016-08-31 NOTE — Telephone Encounter (Signed)
Please advise 

## 2016-08-31 NOTE — Telephone Encounter (Signed)
Okay for orthopedic referral 
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
## 2016-09-14 ENCOUNTER — Ambulatory Visit (INDEPENDENT_AMBULATORY_CARE_PROVIDER_SITE_OTHER): Payer: Self-pay | Admitting: Orthopaedic Surgery

## 2016-11-12 ENCOUNTER — Encounter: Payer: Self-pay | Admitting: Internal Medicine

## 2017-01-12 ENCOUNTER — Other Ambulatory Visit: Payer: Self-pay | Admitting: Internal Medicine

## 2017-01-12 DIAGNOSIS — Z1231 Encounter for screening mammogram for malignant neoplasm of breast: Secondary | ICD-10-CM

## 2017-01-19 DIAGNOSIS — F410 Panic disorder [episodic paroxysmal anxiety] without agoraphobia: Secondary | ICD-10-CM | POA: Diagnosis not present

## 2017-02-10 ENCOUNTER — Ambulatory Visit
Admission: RE | Admit: 2017-02-10 | Discharge: 2017-02-10 | Disposition: A | Discharge status: Home / Self Care | Payer: BLUE CROSS/BLUE SHIELD | Source: Ambulatory Visit | Attending: Internal Medicine | Admitting: Internal Medicine

## 2017-02-10 DIAGNOSIS — Z1231 Encounter for screening mammogram for malignant neoplasm of breast: Secondary | ICD-10-CM

## 2017-09-16 DIAGNOSIS — F410 Panic disorder [episodic paroxysmal anxiety] without agoraphobia: Secondary | ICD-10-CM | POA: Diagnosis not present

## 2018-03-11 ENCOUNTER — Ambulatory Visit: Payer: BLUE CROSS/BLUE SHIELD | Admitting: Family Medicine

## 2018-03-11 ENCOUNTER — Encounter: Payer: Self-pay | Admitting: Family Medicine

## 2018-03-11 ENCOUNTER — Ambulatory Visit (INDEPENDENT_AMBULATORY_CARE_PROVIDER_SITE_OTHER): Payer: BLUE CROSS/BLUE SHIELD

## 2018-03-11 VITALS — BP 110/76 | HR 74 | Temp 98.7°F | Ht 63.0 in | Wt 195.0 lb

## 2018-03-11 DIAGNOSIS — Z7689 Persons encountering health services in other specified circumstances: Secondary | ICD-10-CM

## 2018-03-11 DIAGNOSIS — M1990 Unspecified osteoarthritis, unspecified site: Secondary | ICD-10-CM | POA: Diagnosis not present

## 2018-03-11 DIAGNOSIS — M19041 Primary osteoarthritis, right hand: Secondary | ICD-10-CM | POA: Diagnosis not present

## 2018-03-11 NOTE — Patient Instructions (Signed)
Osteoarthritis  Osteoarthritis is a type of arthritis that affects tissue that covers the ends of bones in joints (cartilage). Cartilage acts as a cushion between the bones and helps them move smoothly. Osteoarthritis results when cartilage in the joints gets worn down. Osteoarthritis is sometimes called "wear and tear" arthritis. Osteoarthritis is the most common form of arthritis. It often occurs in older people. It is a condition that gets worse over time (a progressive condition). Joints that are most often affected by this condition are in:  Fingers.  Toes.  Hips.  Knees.  Spine, including neck and lower back. What are the causes? This condition is caused by age-related wearing down of cartilage that covers the ends of bones. What increases the risk? The following factors may make you more likely to develop this condition:  Older age.  Being overweight or obese.  Overuse of joints, such as in athletes.  Past injury of a joint.  Past surgery on a joint.  Family history of osteoarthritis. What are the signs or symptoms? The main symptoms of this condition are pain, swelling, and stiffness in the joint. The joint may lose its shape over time. Small pieces of bone or cartilage may break off and float inside of the joint, which may cause more pain and damage to the joint. Small deposits of bone (osteophytes) may grow on the edges of the joint. Other symptoms may include:  A grating or scraping feeling inside the joint when you move it.  Popping or creaking sounds when you move. Symptoms may affect one or more joints. Osteoarthritis in a major joint, such as your knee or hip, can make it painful to walk or exercise. If you have osteoarthritis in your hands, you might not be able to grip items, twist your hand, or control small movements of your hands and fingers (fine motor skills). How is this diagnosed? This condition may be diagnosed based on:  Your medical history.  A  physical exam.  Your symptoms.  X-rays of the affected joint(s).  Blood tests to rule out other types of arthritis. How is this treated? There is no cure for this condition, but treatment can help to control pain and improve joint function. Treatment plans may include:  A prescribed exercise program that allows for rest and joint relief. You may work with a physical therapist.  A weight control plan.  Pain relief techniques, such as: ? Applying heat and cold to the joint. ? Electric pulses delivered to nerve endings under the skin (transcutaneous electrical nerve stimulation, or TENS). ? Massage. ? Certain nutritional supplements.  NSAIDs or prescription medicines to help relieve pain.  Medicine to help relieve pain and inflammation (corticosteroids). This can be given by mouth (orally) or as an injection.  Assistive devices, such as a brace, wrap, splint, specialized glove, or cane.  Surgery, such as: ? An osteotomy. This is done to reposition the bones and relieve pain or to remove loose pieces of bone and cartilage. ? Joint replacement surgery. You may need this surgery if you have very bad (advanced) osteoarthritis. Follow these instructions at home: Activity  Rest your affected joints as directed by your health care provider.  Do not drive or use heavy machinery while taking prescription pain medicine.  Exercise as directed. Your health care provider or physical therapist may recommend specific types of exercise, such as: ? Strengthening exercises. These are done to strengthen the muscles that support joints that are affected by arthritis. They can be performed   with weights or with exercise bands to add resistance. ? Aerobic activities. These are exercises, such as brisk walking or water aerobics, that get your heart pumping. ? Range-of-motion activities. These keep your joints easy to move. ? Balance and agility exercises. Managing pain, stiffness, and swelling       If directed, apply heat to the affected area as often as told by your health care provider. Use the heat source that your health care provider recommends, such as a moist heat pack or a heating pad. ? If you have a removable assistive device, remove it as told by your health care provider. ? Place a towel between your skin and the heat source. If your health care provider tells you to keep the assistive device on while you apply heat, place a towel between the assistive device and the heat source. ? Leave the heat on for 20-30 minutes. ? Remove the heat if your skin turns bright red. This is especially important if you are unable to feel pain, heat, or cold. You may have a greater risk of getting burned.  If directed, put ice on the affected joint: ? If you have a removable assistive device, remove it as told by your health care provider. ? Put ice in a plastic bag. ? Place a towel between your skin and the bag. If your health care provider tells you to keep the assistive device on during icing, place a towel between the assistive device and the bag. ? Leave the ice on for 20 minutes, 2-3 times a day. General instructions  Take over-the-counter and prescription medicines only as told by your health care provider.  Maintain a healthy weight. Follow instructions from your health care provider for weight control. These may include dietary restrictions.  Do not use any products that contain nicotine or tobacco, such as cigarettes and e-cigarettes. These can delay bone healing. If you need help quitting, ask your health care provider.  Use assistive devices as directed by your health care provider.  Keep all follow-up visits as told by your health care provider. This is important. Where to find more information  [employer] of Arthritis and Musculoskeletal and Skin Diseases: www.niams.SouthExposed.es  [employer] on Aging: http://kim-miller.com/  American College of Rheumatology:  www.rheumatology.org Contact a health care provider if:  Your skin turns red.  You develop a rash.  You have pain that gets worse.  You have a fever along with joint or muscle aches. Get help right away if:  You lose a lot of weight.  You suddenly lose your appetite.  You have night sweats. Summary  Osteoarthritis is a type of arthritis that affects tissue covering the ends of bones in joints (cartilage).  This condition is caused by age-related wearing down of cartilage that covers the ends of bones.  The main symptom of this condition is pain, swelling, and stiffness in the joint.  There is no cure for this condition, but treatment can help to control pain and improve joint function. This information is not intended to replace advice given to you by your health care provider. Make sure you discuss any questions you have with your health care provider. Document Released: 02/09/2005 Document Revised: 11/16/2016 Document Reviewed: 10/14/2015 Elsevier Interactive Patient Education  2019 [place].  Rheumatoid Arthritis Rheumatoid arthritis (RA) is a long-term (chronic) disease. RA causes inflammation in your joints. Your joints may feel painful, stiff, swollen, and warm. RA may start slowly. It most often affects the small joints of  the hands and feet. It can also affect other parts of the body. Symptoms of RA often come and go. There is no cure for RA, but medicines can help your symptoms. What are the causes?  RA is an autoimmune disease. This means that your body's defense system (immune system) attacks healthy parts of your body by mistake. The exact cause of RA is not known. What increases the risk?  Being a woman.  Having a family history of RA or other diseases like RA.  Smoking.  Being overweight.  Being exposed to pollutants or chemicals. What are the signs or symptoms?  Morning stiffness that lasts longer than 30 minutes. This is often the first  symptom.  Symptoms start slowly. They are often worse in the morning.  As RA gets worse, symptoms may include: ? Pain, stiffness, swelling, warmth, and tenderness in joints on both sides of your body. ? Loss of energy. ? Not feeling hungry. ? Weight loss. ? A low fever. ? Dry eyes and a dry mouth. ? Firm lumps that grow under your skin. ? Changes in the way your joints look. ? Changes in the way your joints work.  Symptoms vary and they: ? Often come and go. ? Sometimes get worse for a period of time. These are called flares. How is this treated?   Treatment may include: ? Taking good care of yourself. Be sure to rest as needed, eat a healthy diet, and exercise. ? Medicines. These may include:  Pain relievers.  Medicines to help with inflammation.  Disease-modifying antirheumatic drugs (DMARDs).  Medicines called biologic response modifiers. ? Physical therapy and occupational therapy. ? Surgery, if joint damage is very bad. Your doctor will work with you to find the best treatments. Follow these instructions at home: Activity  Return to your normal activities as told by your doctor. Ask your doctor what activities are safe for you.  Rest when you have a flare.  Exercise as told by your doctor. General instructions  Take over-the-counter and prescription medicines only as told by your doctor.  Keep all follow-up visits as told by your doctor. This is important. Where to find more information  [employer] of Rheumatology: www.rheumatology.[place]: www.arthritis.org Contact a doctor if:  You have a flare.  You have a fever.  You have problems because of your medicines. Get help right away if:  You have chest pain.  You have trouble breathing.  You get a hot, painful joint all of a sudden, and it is worse than your normal joint aches. Summary  RA is a long-term disease.  Symptoms of RA start slowly. They are often worse in the  morning.  RA causes inflammation in your joints. This information is not intended to replace advice given to you by your health care provider. Make sure you discuss any questions you have with your health care provider. Document Released: 05/04/2011 Document Revised: 10/13/2017 Document Reviewed: 10/13/2017 Elsevier Interactive Patient Education  2019 [employer].

## 2018-03-11 NOTE — Progress Notes (Signed)
Subjective:    Patient ID: Michaela Gutierrez, female    DOB: [birth year], 53 y.o.   MRN: [id number]  No chief complaint on file.   HPI Patient was seen today for re-establish care.  Previously seen by Dr. Burnice Logan over 3 yrs ago.    Hand pain: -pt told had OA.   -seen by Rheumatology, Dr. Dora Sims in the past.  Had blood work and neck xray. -states had labs, but no imaging -endorses R hand worse than L.  Difficulty with flexion of digits of R hand. -taking ibuprofen 800 mg 3x per wk, Tylenol, heat and ice. -in the past used mobic -pt's MGM had OA -pt concerned about progression as she works in L&D as a Marine scientist.  Anxiety: -pt taking wellbutrin XL 300 mg, Zoloft 150 mg, ambien 10mg  -followed by Dr. Benard Rink -endorses good mood, decreased energy, ok sleep.   -only takes [place] if does not have to work the next day.  Past Surg Hx: Hysterectomy at in her 98s for heavy bleeding 2/2 placenta accreta   History reviewed. No pertinent past medical history.  No Known Allergies  ROS General: Denies fever, chills, night sweats, changes in weight, changes in appetite HEENT: Denies headaches, ear pain, changes in vision, rhinorrhea, sore throat CV: Denies CP, palpitations, SOB, orthopnea Pulm: Denies SOB, cough, wheezing GI: Denies abdominal pain, nausea, vomiting, diarrhea, constipation GU: Denies dysuria, hematuria, frequency, vaginal discharge Msk: Denies muscle cramps   +hand pain and deformity Neuro: Denies weakness, numbness, tingling Skin: Denies rashes, bruising Psych: Denies depression, anxiety, hallucinations    Objective:    Blood pressure 110/76, pulse 74, temperature 98.7 F (37.1 C), temperature source Oral, height 5\' 3"  (1.6 m), weight 195 lb (88.5 kg), SpO2 98 %.  Gen. Pleasant, well-nourished, in no distress, normal affect  HEENT: [place]/AT, face symmetric, no scleral icterus, PERRLA, nares patent without drainage, pharynx without erythema or exudate. Lungs: no accessory  muscle use, CTAB, no wheezes or rales Cardiovascular: RRR, no m/r/g, no peripheral edema Musculoskeletal: Mild TTP of digits of R hand.  R hand with mild edema, nodules on DIP joints, taught skin of digits between DIP and PIP joints, decreased  ROM of digits.  L thumb with decreased flexion.  No cyanosis or clubbing, normal tone Neuro:  A&Ox3, CN II-XII intact, normal gait  Wt Readings from Last 3 Encounters:  03/11/18 195 lb (88.5 kg)  02/08/15 200 lb (90.7 kg)  10/27/12 201 lb (91.2 kg)    Lab Results  Component Value Date   WBC 7.1 09/03/2009   HGB 14.0 09/03/2009   HCT 41.0 09/03/2009   PLT 228.0 09/03/2009   GLUCOSE 97 09/03/2009   CHOL 222 (H) 09/03/2009   TRIG 96.0 09/03/2009   HDL 55.30 09/03/2009   LDLDIRECT 160.4 09/03/2009   ALT 21 09/03/2009   AST 18 09/03/2009   NA 142 09/03/2009   K 5.2 (H) 09/03/2009   CL 107 09/03/2009   CREATININE 0.8 09/03/2009   BUN 11 09/03/2009   CO2 28 09/03/2009   TSH 0.90 09/03/2009    Assessment/Plan:  Arthritis  -likely OA given hx, however will obtain imaging -discussed obtaining labs to r/o RA depending of xray results. -given handouts -for now continue NSAIDs with food. -will place referral if needed. - Plan: DG Hand Complete Right  Encounter to establish care -pt re-establishing care as last seen >3 yrs ago   F/u in the next few wks for CPE.  Grier Mitts, MD

## 2018-06-02 DIAGNOSIS — F410 Panic disorder [episodic paroxysmal anxiety] without agoraphobia: Secondary | ICD-10-CM | POA: Diagnosis not present

## 2018-07-07 IMAGING — MG 2D DIGITAL SCREENING BILATERAL MAMMOGRAM WITH CAD AND ADJUNCT TO
8 of 12 series · 8 of 28 positions shown · non-contrast
Comparison: Previous exam(s).

CLINICAL DATA: Screening.

EXAM:
2D DIGITAL SCREENING BILATERAL MAMMOGRAM WITH CAD AND ADJUNCT TOMO

[L MLO]
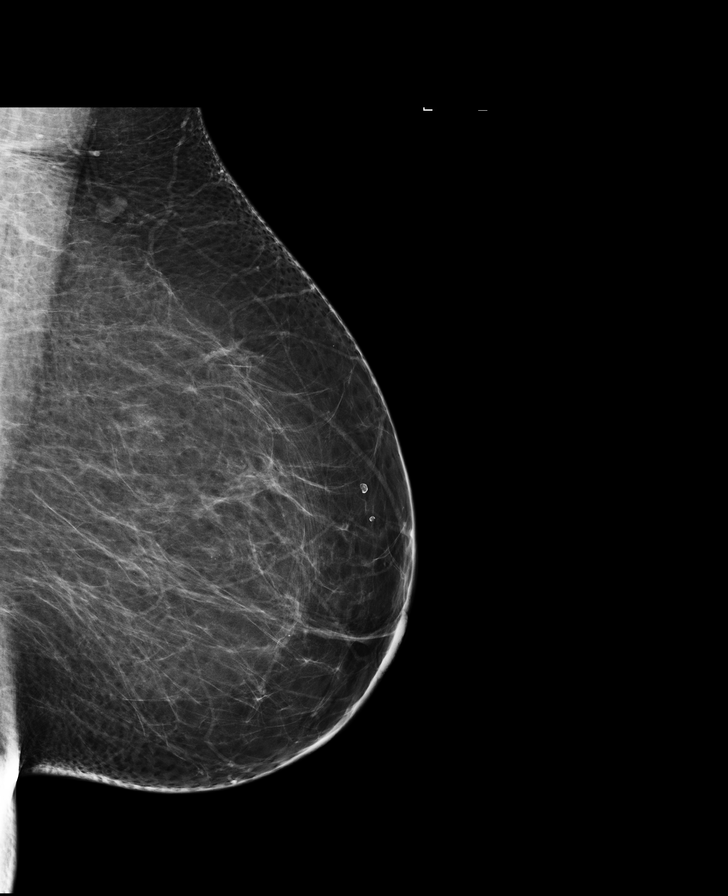

[R MLO synth-2D]
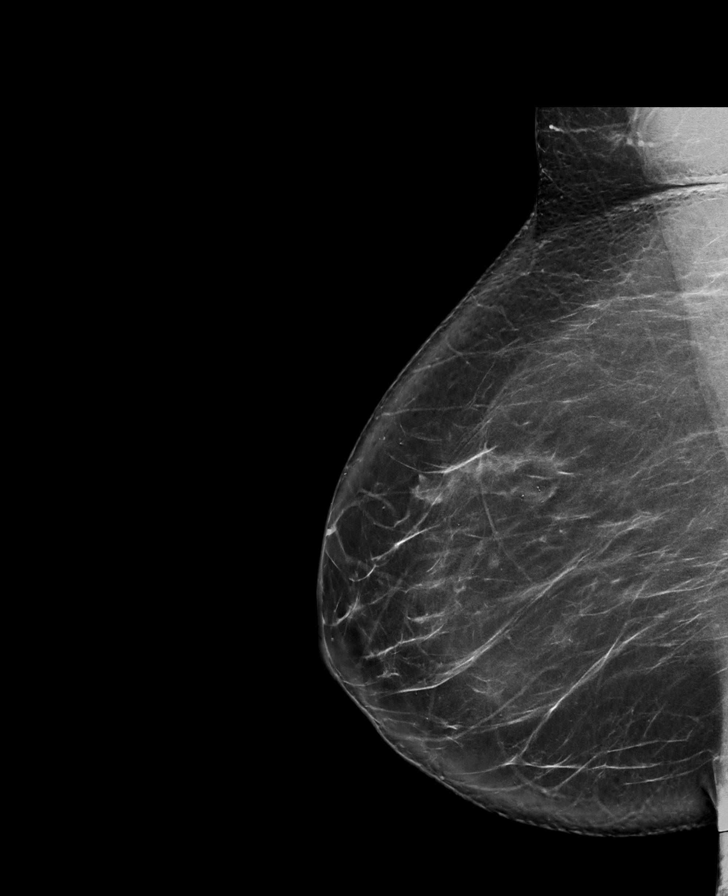

[R MLO]
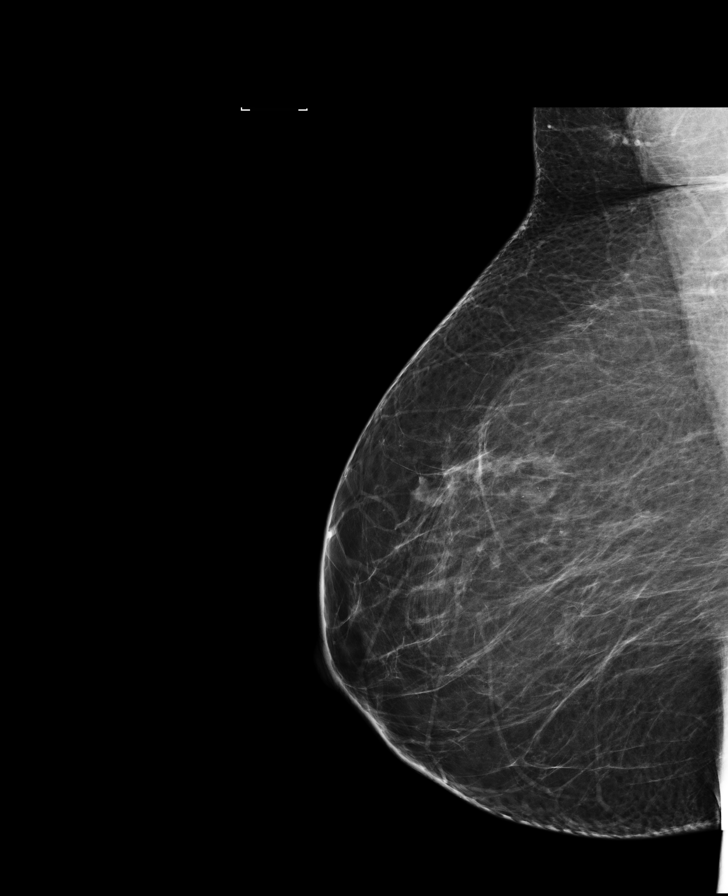

[R CC synth-2D]
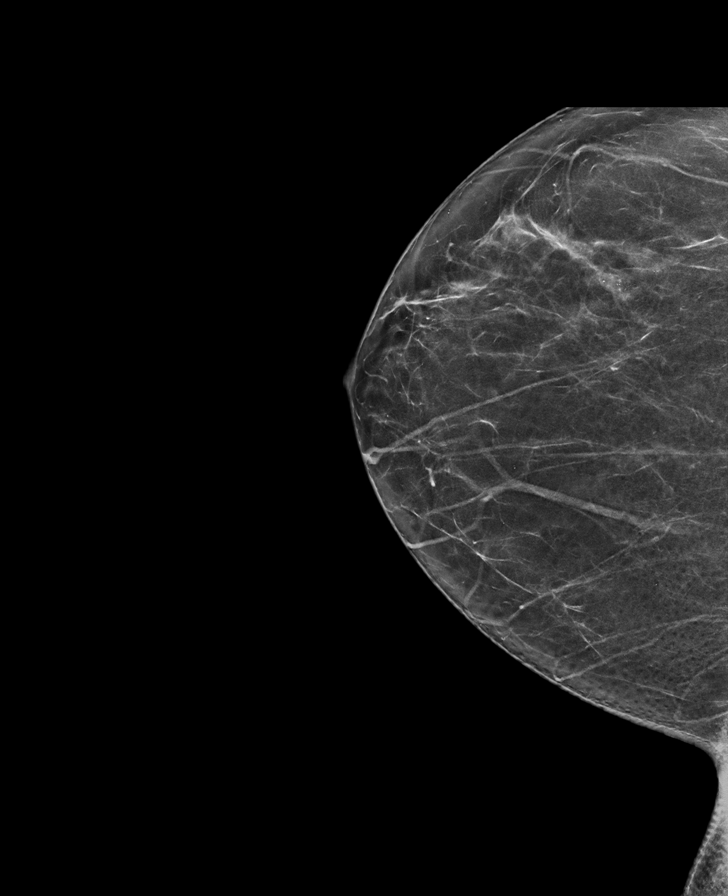

[L CC]
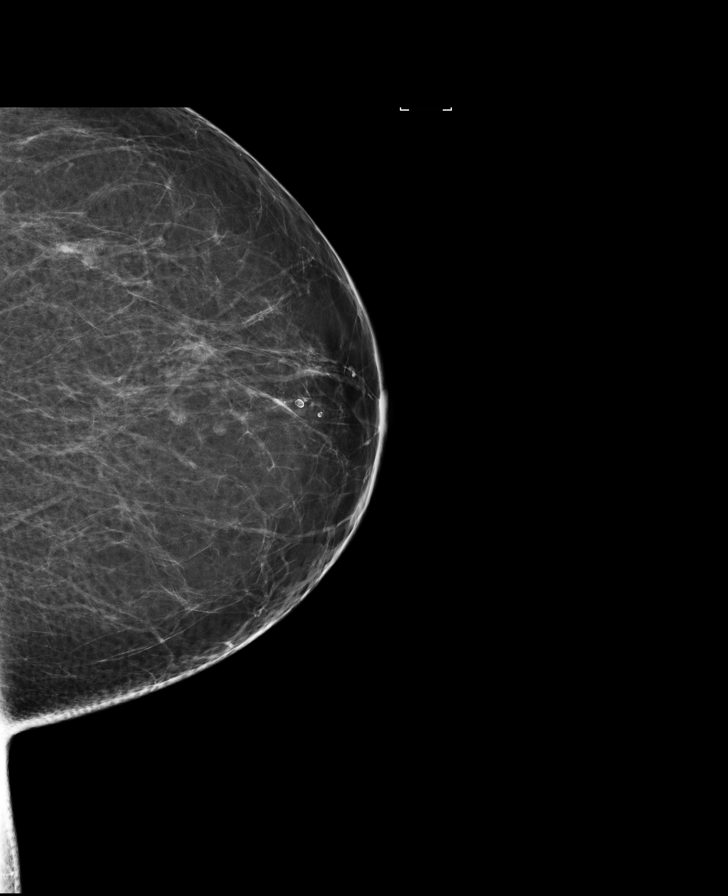

[L MLO synth-2D]
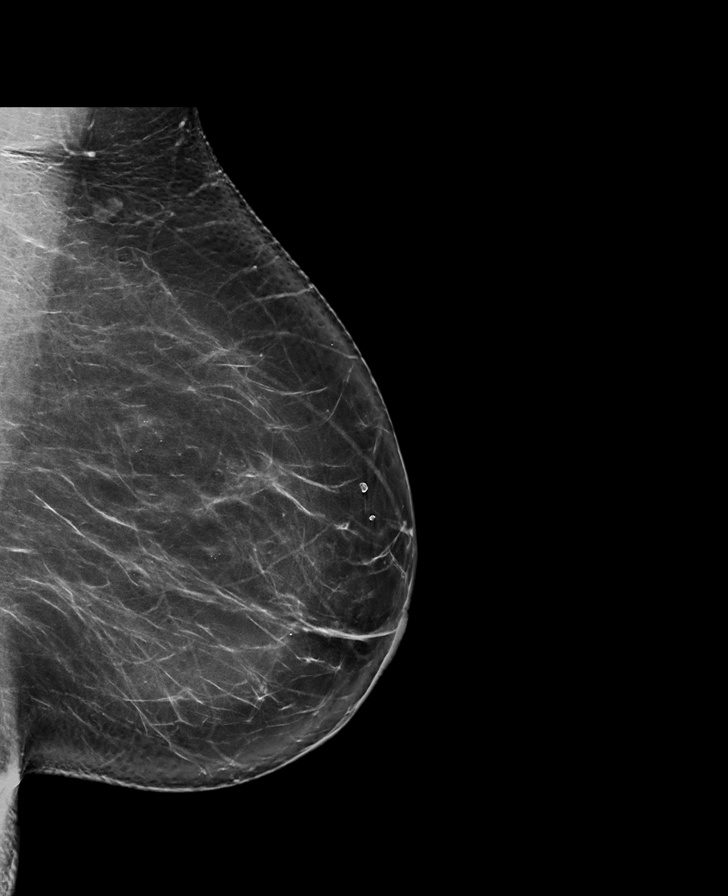

[L CC synth-2D]
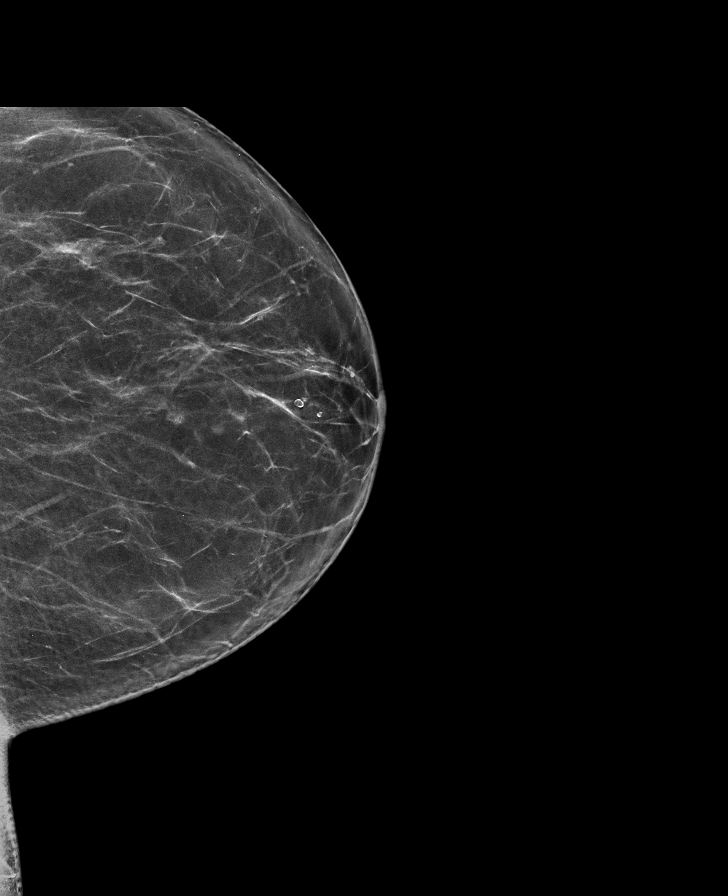

[R CC]
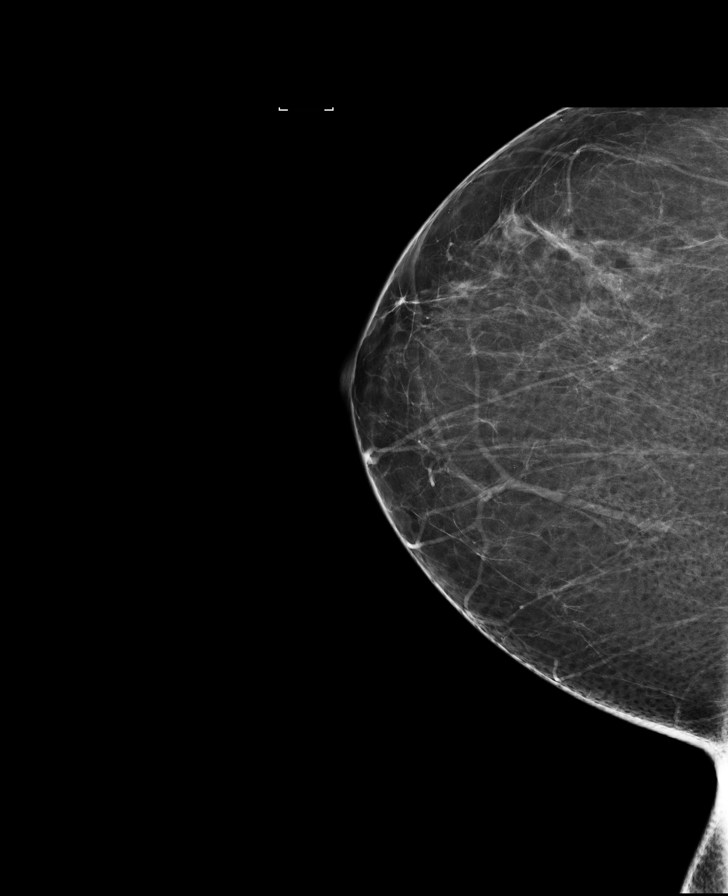

[8 of 28 positions shown; findings below may reference images not displayed]

ACR Breast Density Category b: There are scattered areas of
fibroglandular density.
FINDINGS: There are no findings suspicious for malignancy. Images were
processed with CAD.
IMPRESSION: No mammographic evidence of malignancy. A result letter of this
screening mammogram will be mailed directly to the patient.

RECOMMENDATION:
Screening mammogram in one year. (Code:97-6-RS4)

BI-RADS CATEGORY  1: Negative.

## 2018-08-17 DIAGNOSIS — M25775 Osteophyte, left foot: Secondary | ICD-10-CM | POA: Diagnosis not present

## 2018-08-17 DIAGNOSIS — L600 Ingrowing nail: Secondary | ICD-10-CM | POA: Diagnosis not present

## 2018-09-29 ENCOUNTER — Telehealth: Payer: BLUE CROSS/BLUE SHIELD | Admitting: Physician Assistant

## 2018-09-29 ENCOUNTER — Other Ambulatory Visit: Payer: Self-pay | Admitting: Family Medicine

## 2018-09-29 DIAGNOSIS — R070 Pain in throat: Secondary | ICD-10-CM

## 2018-09-29 DIAGNOSIS — Z1231 Encounter for screening mammogram for malignant neoplasm of breast: Secondary | ICD-10-CM

## 2018-09-29 NOTE — Progress Notes (Signed)
Based on what you shared with me, I feel your condition warrants further evaluation and I recommend that you be seen for a face to face office visit.  NOTE: If you entered your credit card information for this eVisit, you will not be charged. You may see a "hold" on your card for the $35 but that hold will drop off and you will not have a charge processed.  If you are having a true medical emergency please call 911.     For an urgent face to face visit, [place] has five urgent care centers for your convenience:    [email or web address] to reserve your spot online an avoid wait times  [facility] (New Address!) [address] [place], [place] [address] *Just off [address], across the road from [place] hours of operation: Monday-Friday, 12 PM to 6 PM  Closed Saturday & Sunday   The following sites will take your insurance:  . [facility]    [phone]                  Get Driving Directions  [address], [place] [address] . 10 am to 8 pm Monday-Friday . 12 pm to 8 pm Saturday-Sunday   . [facility] at [place]                  Get Driving Directions  [address], [place], [place] [address] . 8 am to 8 pm Monday-Friday . 9 am to 6 pm Saturday . 11 am to 6 pm Sunday   . [facility] at [place]                  Get Driving Directions   [address] [place], [place] [address] . 8 am to 8 pm Monday-Friday . 8 am to 4 pm Saturday-Sunday    . [facility] at [place]                    Get Driving Directions  [phone]  [address], [place],  [address]  . Monday-Friday, 12 PM to 6 PM    Your e-visit answers were reviewed by a board certified advanced clinical practitioner to complete your personal care plan.  Thank you for using e-Visits.   I have spent 5 minutes in review of e-visit questionnaire, review and updating patient chart, medical decision making and response to patient.    Tenna Delaine, PA-C

## 2018-09-30 ENCOUNTER — Telehealth: Payer: Self-pay | Admitting: Family Medicine

## 2018-09-30 NOTE — Telephone Encounter (Signed)
error 

## 2018-09-30 NOTE — Telephone Encounter (Signed)
ok 

## 2018-09-30 NOTE — Telephone Encounter (Signed)
Pt called and said she wanted to transfer care from Dr Volanda Napoleon to Dr Ethlyn Gallery is this ok?

## 2018-10-02 NOTE — Telephone Encounter (Signed)
ok 

## 2018-10-03 NOTE — Telephone Encounter (Signed)
I left a detailed message for the pt to call back for an appt as below.

## 2018-11-14 ENCOUNTER — Other Ambulatory Visit: Payer: Self-pay

## 2018-11-14 ENCOUNTER — Ambulatory Visit
Admission: RE | Admit: 2018-11-14 | Discharge: 2018-11-14 | Disposition: A | Discharge status: Home / Self Care | Payer: BLUE CROSS/BLUE SHIELD | Source: Ambulatory Visit | Attending: Family Medicine | Admitting: Family Medicine

## 2018-11-14 DIAGNOSIS — Z1231 Encounter for screening mammogram for malignant neoplasm of breast: Secondary | ICD-10-CM | POA: Diagnosis not present

## 2018-12-19 ENCOUNTER — Ambulatory Visit: Payer: BC Managed Care – PPO | Admitting: Podiatry

## 2018-12-20 ENCOUNTER — Ambulatory Visit: Payer: BC Managed Care – PPO | Admitting: Podiatry

## 2019-01-11 DIAGNOSIS — F410 Panic disorder [episodic paroxysmal anxiety] without agoraphobia: Secondary | ICD-10-CM | POA: Diagnosis not present

## 2019-04-26 DIAGNOSIS — Z13228 Encounter for screening for other metabolic disorders: Secondary | ICD-10-CM | POA: Diagnosis not present

## 2019-04-26 DIAGNOSIS — Z1159 Encounter for screening for other viral diseases: Secondary | ICD-10-CM | POA: Diagnosis not present

## 2019-04-26 DIAGNOSIS — E785 Hyperlipidemia, unspecified: Secondary | ICD-10-CM | POA: Diagnosis not present

## 2019-04-26 DIAGNOSIS — N951 Menopausal and female climacteric states: Secondary | ICD-10-CM | POA: Diagnosis not present

## 2019-04-26 DIAGNOSIS — R638 Other symptoms and signs concerning food and fluid intake: Secondary | ICD-10-CM | POA: Diagnosis not present

## 2019-05-03 DIAGNOSIS — Z283 Underimmunization status: Secondary | ICD-10-CM | POA: Diagnosis not present

## 2019-05-03 DIAGNOSIS — M722 Plantar fascial fibromatosis: Secondary | ICD-10-CM | POA: Insufficient documentation

## 2019-05-03 DIAGNOSIS — E785 Hyperlipidemia, unspecified: Secondary | ICD-10-CM | POA: Diagnosis not present

## 2019-05-03 DIAGNOSIS — Z0000 Encounter for general adult medical examination without abnormal findings: Secondary | ICD-10-CM | POA: Diagnosis not present

## 2019-05-25 DIAGNOSIS — N951 Menopausal and female climacteric states: Secondary | ICD-10-CM | POA: Diagnosis not present

## 2019-05-25 DIAGNOSIS — Z6833 Body mass index (BMI) 33.0-33.9, adult: Secondary | ICD-10-CM | POA: Diagnosis not present

## 2019-08-08 ENCOUNTER — Ambulatory Visit: Payer: BC Managed Care – PPO | Admitting: Podiatry

## 2019-08-16 ENCOUNTER — Ambulatory Visit: Payer: BC Managed Care – PPO | Admitting: Podiatry

## 2019-09-06 ENCOUNTER — Other Ambulatory Visit (HOSPITAL_COMMUNITY): Payer: Self-pay | Admitting: *Deleted

## 2019-09-06 DIAGNOSIS — F410 Panic disorder [episodic paroxysmal anxiety] without agoraphobia: Secondary | ICD-10-CM | POA: Diagnosis not present

## 2020-01-29 ENCOUNTER — Encounter: Payer: Self-pay | Admitting: Osteopathic Medicine

## 2020-01-29 ENCOUNTER — Ambulatory Visit (INDEPENDENT_AMBULATORY_CARE_PROVIDER_SITE_OTHER): Payer: 59 | Admitting: Osteopathic Medicine

## 2020-01-29 VITALS — BP 127/79 | HR 101 | Temp 98.0°F | Ht 65.0 in | Wt 196.1 lb

## 2020-01-29 DIAGNOSIS — R635 Abnormal weight gain: Secondary | ICD-10-CM | POA: Diagnosis not present

## 2020-01-29 DIAGNOSIS — M19049 Primary osteoarthritis, unspecified hand: Secondary | ICD-10-CM | POA: Diagnosis not present

## 2020-01-29 DIAGNOSIS — Z780 Asymptomatic menopausal state: Secondary | ICD-10-CM | POA: Diagnosis not present

## 2020-01-29 DIAGNOSIS — Z0000 Encounter for general adult medical examination without abnormal findings: Secondary | ICD-10-CM

## 2020-01-29 DIAGNOSIS — M8949 Other hypertrophic osteoarthropathy, multiple sites: Secondary | ICD-10-CM

## 2020-01-29 DIAGNOSIS — F329 Major depressive disorder, single episode, unspecified: Secondary | ICD-10-CM | POA: Diagnosis not present

## 2020-01-29 DIAGNOSIS — F411 Generalized anxiety disorder: Secondary | ICD-10-CM | POA: Diagnosis not present

## 2020-01-29 DIAGNOSIS — M150 Primary generalized (osteo)arthritis: Secondary | ICD-10-CM

## 2020-01-29 DIAGNOSIS — M159 Polyosteoarthritis, unspecified: Secondary | ICD-10-CM

## 2020-01-29 DIAGNOSIS — Z90710 Acquired absence of both cervix and uterus: Secondary | ICD-10-CM | POA: Diagnosis not present

## 2020-01-29 DIAGNOSIS — R6882 Decreased libido: Secondary | ICD-10-CM | POA: Diagnosis not present

## 2020-01-29 DIAGNOSIS — Z1211 Encounter for screening for malignant neoplasm of colon: Secondary | ICD-10-CM

## 2020-01-29 HISTORY — DX: Acquired absence of both cervix and uterus: Z90.710

## 2020-01-29 LAB — COMPLETE METABOLIC PANEL WITH GFR
Glucose, Bld: 131 mg/dL (ref 65–139)
Total Bilirubin: 0.3 mg/dL (ref 0.2–1.2)

## 2020-01-29 MED ORDER — ESTRADIOL 0.1 MG/24HR TD PTTW: 1.0000 | MEDICATED_PATCH | TRANSDERMAL | 1 refills | Status: DC

## 2020-01-29 NOTE — Patient Instructions (Signed)
General Preventive Care  Most recent routine screening labs: ordered .   Blood pressure goal 130/80 or less.   Tobacco: don't!   Alcohol: responsible moderation is ok for most adults - if you have concerns about your alcohol intake, please talk to me!   Exercise: as tolerated to reduce risk of cardiovascular disease and diabetes. Strength training will also prevent osteoporosis.   Mental health: if need for mental health care (medicines, counseling, other), or concerns about moods, please let me know!   Sexual / Reproductive health: if need for STD testing, or if concerns with libido/pain problems, please let me know!  Advanced Directive: Living Will and/or Healthcare Power of Attorney recommended for all adults, regardless of age or health.  Vaccines  Flu vaccine: for almost everyone, every fall.   Shingles vaccine: after age 37.   Pneumonia vaccines: after age 27.  Tetanus booster: every 10 years - due 2023  COVID vaccine: THANKS for getting your vaccine! :)  Cancer screenings   Colon cancer screening: for everyone age 77-75. Colonoscopy available for all, many people also qualify for the Cologuard stool test   Breast cancer screening: mammogram  every other year at least, option for annually after age 37.   Cervical cancer screening: Pap every 1 to 5 years depending on age and other risk factors. Can usually stop at age 17 or w/ hysterectomy.   Lung cancer screening: not needed for non-smokers  Infection screenings  . HIV: recommended screening at least once age 83-65, more often as needed. . Gonorrhea/Chlamydia: screening as needed . Hepatitis C: recommended once for everyone age 29-75 . TB: certain at-risk populations, or depending on work requirements and/or travel history Other . Bone Density Test: recommended at age 71

## 2020-01-29 NOTE — Progress Notes (Signed)
HPI: Michaela Gutierrez is a 54 y.o. female who  has a past medical history of History of hysterectomy (01/29/2020).  she presents to [facility] today, 01/29/20,  for chief complaint of: New to establish care Concern for postmenopausal - on estrogen which helps hot flashes, weight gain and low libido still a problem  Hx anx/depression - following w/ psychiatry and stable on current Rx as below     Past medical, surgical, social and family history reviewed:  Patient Active Problem List   Diagnosis Date Noted  . History of hysterectomy 01/29/2020  . Osteoarthritis 10/27/2012  . Heberden's nodes 10/27/2012  . MDD (major depressive disorder) 05/26/2007  . Generalized anxiety disorder 07/27/2006    Past Surgical History:  Procedure Laterality Date  . ABDOMINAL HYSTERECTOMY    . Broken bone repair    . CESAREAN SECTION      Social History   Tobacco Use  . Smoking status: Former Smoker    Types: Cigarettes  . Smokeless tobacco: Never Used  Substance Use Topics  . Alcohol use: Not Currently    Alcohol/week: 1.0 standard drink    Types: 1 Standard drinks or equivalent per week    Family History  Problem Relation Age of Onset  . Diabetes Mother   . Depression Mother   . Anxiety disorder Mother   . Arthritis Mother   . Hypertension Mother   . Colon cancer Paternal Grandmother   . Colon cancer Paternal Grandfather      Current medication list and allergy/intolerance information reviewed:    Current Outpatient Medications  Medication Sig Dispense Refill  . buPROPion (WELLBUTRIN XL) 300 MG 24 hr tablet Take 300 mg by mouth daily.    Marland Kitchen estradiol (VIVELLE-DOT) 0.1 MG/24HR patch Place 1 patch (0.1 mg total) onto the skin 2 (two) times a week. 24 patch 1  . sertraline (ZOLOFT) 100 MG tablet Take 100 mg by mouth daily.    Marland Kitchen zolpidem (AMBIEN) 10 MG tablet Take 10 mg by mouth at bedtime as needed for sleep.     No current facility-administered  medications for this visit.    No Known Allergies    Review of Systems:  Constitutional:  No  fever, no chills, No recent illness, +unintentional weight changes. No significant fatigue.   HEENT: +headache, no vision change, no hearing change, No sore throat, +sinus pressure  Cardiac: No  chest pain, No  pressure, No palpitations, No  Orthopnea  Respiratory:  No  shortness of breath. No  Cough  Gastrointestinal: No  abdominal pain, No  nausea, No  vomiting,  No  blood in stool, No  diarrhea, No  constipation   Musculoskeletal: No new myalgia/arthralgia  Skin: No  Rash, No other wounds/concerning lesions  Genitourinary: No  incontinence, No  abnormal genital bleeding, No abnormal genital discharge  Hem/Onc: No  easy bruising/bleeding, No  abnormal lymph node  Endocrine: No cold intolerance,  No heat intolerance. No polyuria/polydipsia/polyphagia   Neurologic: No  weakness, No  dizziness, No  slurred speech/focal weakness/facial droop  Psychiatric: No  concerns with depression, +concerns with anxiety, No sleep problems, No mood problems  Exam:  BP 127/79 (BP Location: Left Arm, Patient Position: Sitting, Cuff Size: Normal)   Pulse (!) 101   Temp 98 F (36.7 C) (Oral)   Ht 5\' 5"  (1.651 m)   Wt 196 lb 1.9 oz (89 kg)   BMI 32.64 kg/m   Constitutional: VS see above. General Appearance: alert,  well-developed, well-nourished, NAD  Eyes: Normal lids and conjunctive, non-icteric sclera  Ears, Nose, Mouth, Throat: MMM, Normal external inspection ears/nares/mouth/lips/gums. TM normal bilaterally. Pharynx/tonsils no erythema, no exudate. Nasal mucosa normal.   Neck: No masses, trachea midline. No thyroid enlargement. No tenderness/mass appreciated. No lymphadenopathy  Respiratory: Normal respiratory effort. no wheeze, no rhonchi, no rales  Cardiovascular: S1/S2 normal, no murmur, no rub/gallop auscultated. RRR. No lower extremity edema.   Gastrointestinal: Nontender, no  masses.   Musculoskeletal: Gait normal. No clubbing/cyanosis of digits.   Neurological: Normal balance/coordination. No tremor.  Skin: warm, dry, intact. No rash/ulcer.  Psychiatric: Normal judgment/insight. Normal mood and affect. Oriented x3.    No results found for this or any previous visit (from the past 72 hour(s)).  No results found.   ASSESSMENT/PLAN: The primary encounter diagnosis was Annual physical exam. Diagnoses of Generalized anxiety disorder, Major depressive disorder, remission status unspecified, unspecified whether recurrent, Decreased libido, Primary osteoarthritis involving multiple joints, Hand arthritis, Abnormal weight gain, History of hysterectomy, Postmenopausal, and Colon cancer screening were also pertinent to this visit.  1. Annual physical exam See below for review preventive care  --> GI referral for colonoscopy   2. Generalized anxiety disorder 3. Major depressive disorder, remission status unspecified, unspecified whether recurrent Continue current Rx and f/u w/ psych SSRI possibly contibuting to weight gain / libido issues? Postmenopausal seems more likely though.   4. Decreased libido --> Can trial increasing estrogen, no FH breast CA and pt reports UTD on mammograms   5. Primary osteoarthritis involving multiple joints 6. Hand arthritis Significant heberden's nodes, pt reports previous w/u for RA negative, --> refer hand ortho  7. Abnormal weight gain TSH pending, consider Wegovy   8. History of hysterectomy Hx placenta previa/accreta, hysterectomy d/t scar tissu  9. Postmenopausal Trial increase estrogen as above     Orders Placed This Encounter  Procedures  . CBC  . COMPLETE METABOLIC PANEL WITH GFR  . Lipid panel  . TSH  . Rheumatoid factor  . ANA  . Ambulatory referral to Gastroenterology  . Ambulatory referral to Orthopedic Surgery    Meds ordered this encounter  Medications  . estradiol (VIVELLE-DOT) 0.1 MG/24HR  patch    Sig: Place 1 patch (0.1 mg total) onto the skin 2 (two) times a week.    Dispense:  24 patch    Refill:  1    Patient Instructions  General Preventive Care  Most recent routine screening labs: ordered .   Blood pressure goal 130/80 or less.   Tobacco: don't!   Alcohol: responsible moderation is ok for most adults - if you have concerns about your alcohol intake, please talk to me!   Exercise: as tolerated to reduce risk of cardiovascular disease and diabetes. Strength training will also prevent osteoporosis.   Mental health: if need for mental health care (medicines, counseling, other), or concerns about moods, please let me know!   Sexual / Reproductive health: if need for STD testing, or if concerns with libido/pain problems, please let me know!  Advanced Directive: Living Will and/or Healthcare Power of Attorney recommended for all adults, regardless of age or health.  Vaccines  Flu vaccine: for almost everyone, every fall.   Shingles vaccine: after age 46.   Pneumonia vaccines: after age 52.  Tetanus booster: every 10 years - due 2023  COVID vaccine: THANKS for getting your vaccine! :)  Cancer screenings   Colon cancer screening: for everyone age 57-75. Colonoscopy available for all, many people  also qualify for the Cologuard stool test   Breast cancer screening: mammogram  every other year at least, option for annually after age 70.   Cervical cancer screening: Pap every 1 to 5 years depending on age and other risk factors. Can usually stop at age 20 or w/ hysterectomy.   Lung cancer screening: not needed for non-smokers  Infection screenings  . HIV: recommended screening at least once age 92-65, more often as needed. . Gonorrhea/Chlamydia: screening as needed . Hepatitis C: recommended once for everyone age 6-75 . TB: certain at-risk populations, or depending on work requirements and/or travel history Other . Bone Density Test: recommended at age  5       Visit summary with medication list and pertinent instructions was printed for patient to review. All questions at time of visit were answered - patient instructed to contact office with any additional concerns or updates. ER/RTC precautions were reviewed with the patient.     Please note: voice recognition software was used to produce this document, and typos may escape review. Please contact Dr. Sheppard Coil for any needed clarifications.     Follow-up plan: Return in about 4 months (around 05/29/2020) for weight check .

## 2020-01-30 ENCOUNTER — Encounter: Payer: Self-pay | Admitting: Osteopathic Medicine

## 2020-02-01 ENCOUNTER — Other Ambulatory Visit: Payer: Self-pay | Admitting: Osteopathic Medicine

## 2020-02-01 ENCOUNTER — Encounter: Payer: Self-pay | Admitting: Osteopathic Medicine

## 2020-02-01 LAB — CBC
HCT: 42 % (ref 35.0–45.0)
Hemoglobin: 14.2 g/dL (ref 11.7–15.5)
MCH: 28.3 pg (ref 27.0–33.0)
MCHC: 33.8 g/dL (ref 32.0–36.0)
MCV: 83.7 fL (ref 80.0–100.0)
MPV: 9.7 fL (ref 7.5–12.5)
Platelets: 263 10*3/uL (ref 140–400)
RBC: 5.02 10*6/uL (ref 3.80–5.10)
RDW: 12.7 % (ref 11.0–15.0)
WBC: 7 10*3/uL (ref 3.8–10.8)

## 2020-02-01 LAB — ANTI-NUCLEAR AB-TITER (ANA TITER): ANA Titer 1: 1:40 {titer} — ABNORMAL HIGH

## 2020-02-01 LAB — COMPLETE METABOLIC PANEL WITH GFR
AG Ratio: 1.5 (calc) (ref 1.0–2.5)
ALT: 25 U/L (ref 6–29)
AST: 17 U/L (ref 10–35)
Albumin: 4.4 g/dL (ref 3.6–5.1)
Alkaline phosphatase (APISO): 64 U/L (ref 37–153)
BUN: 11 mg/dL (ref 7–25)
CO2: 29 mmol/L (ref 20–32)
Calcium: 9.6 mg/dL (ref 8.6–10.4)
Chloride: 103 mmol/L (ref 98–110)
Creat: 0.72 mg/dL (ref 0.50–1.05)
GFR, Est African American: 112 mL/min/{1.73_m2} (ref >=60)
GFR, Est Non African American: 96 mL/min/{1.73_m2} (ref >=60)
Globulin: 3 g/dL (calc) (ref 1.9–3.7)
Potassium: 4.3 mmol/L (ref 3.5–5.3)
Sodium: 139 mmol/L (ref 135–146)
Total Protein: 7.4 g/dL (ref 6.1–8.1)

## 2020-02-01 LAB — LIPID PANEL
Cholesterol: 269 mg/dL — ABNORMAL HIGH (ref <200)
HDL: 57 mg/dL (ref >=50)
LDL Cholesterol (Calc): 174 mg/dL (calc) — ABNORMAL HIGH
Non-HDL Cholesterol (Calc): 212 mg/dL (calc) — ABNORMAL HIGH (ref <130)
Total CHOL/HDL Ratio: 4.7 (calc) (ref <5.0)
Triglycerides: 211 mg/dL — ABNORMAL HIGH (ref <150)

## 2020-02-01 LAB — ANA: Anti Nuclear Antibody (ANA): POSITIVE — AB

## 2020-02-01 LAB — RHEUMATOID FACTOR: Rheumatoid fact SerPl-aCnc: <14 IU/mL (ref <14)

## 2020-02-01 LAB — TSH: TSH: 0.6 mIU/L

## 2020-02-01 MED ORDER — WEGOVY 1 MG/0.5ML ~~LOC~~ SOAJ: 1.0000 mg | SUBCUTANEOUS | 0 refills | Status: DC

## 2020-02-01 MED ORDER — WEGOVY 1.7 MG/0.75ML ~~LOC~~ SOAJ: 1.7000 mg | SUBCUTANEOUS | 0 refills | Status: DC

## 2020-02-01 MED ORDER — WEGOVY 0.5 MG/0.5ML ~~LOC~~ SOAJ: 0.5000 mg | SUBCUTANEOUS | 0 refills | Status: DC

## 2020-02-01 MED ORDER — WEGOVY 0.25 MG/0.5ML ~~LOC~~ SOAJ: 0.2500 mg | SUBCUTANEOUS | 0 refills | Status: DC

## 2020-02-01 MED ORDER — WEGOVY 2.4 MG/0.75ML ~~LOC~~ SOAJ: 2.4000 mg | SUBCUTANEOUS | 0 refills | Status: DC

## 2020-02-02 MED FILL — WEGOVY 0.25 MG/0.5ML SOAJ: 0.25 | 28 days supply | Qty: 2 | Fill #0

## 2020-02-06 ENCOUNTER — Encounter: Payer: Self-pay | Admitting: Gastroenterology

## 2020-02-21 ENCOUNTER — Other Ambulatory Visit: Payer: Self-pay | Admitting: Osteopathic Medicine

## 2020-02-21 MED ORDER — WEGOVY 0.25 MG/0.5ML ~~LOC~~ SOAJ: 0.2500 mg | SUBCUTANEOUS | 0 refills | Status: DC

## 2020-02-21 MED ORDER — WEGOVY 2.4 MG/0.75ML ~~LOC~~ SOAJ: 2.4000 mg | SUBCUTANEOUS | 0 refills | Status: DC

## 2020-02-21 MED ORDER — WEGOVY 1.7 MG/0.75ML ~~LOC~~ SOAJ: 1.7000 mg | SUBCUTANEOUS | 0 refills | Status: DC

## 2020-02-21 MED ORDER — WEGOVY 1 MG/0.5ML ~~LOC~~ SOAJ: 1.0000 mg | SUBCUTANEOUS | 0 refills | Status: DC

## 2020-02-21 MED ORDER — WEGOVY 0.5 MG/0.5ML ~~LOC~~ SOAJ: 0.5000 mg | SUBCUTANEOUS | 0 refills | Status: DC

## 2020-02-21 MED FILL — WEGOVY 0.5 MG/0.5ML SOAJ: 0.5 | 28 days supply | Qty: 2 | Fill #0

## 2020-02-23 MED FILL — buPROPion HCL ER (XL) 300 M: 300 | 60 days supply | Qty: 60 | Fill #0

## 2020-02-23 MED FILL — SERTRALINE HCL 100 MG TAB: 100 | 90 days supply | Qty: 135 | Fill #0

## 2020-02-23 MED FILL — ZOLPIDEM TARTRATE 10 MG TAB: 10 | 30 days supply | Qty: 30 | Fill #0

## 2020-03-06 ENCOUNTER — Telehealth: Payer: Self-pay | Admitting: Neurology

## 2020-03-06 NOTE — Telephone Encounter (Signed)
Uploaded patients new insurance in the chart. AM

## 2020-03-06 NOTE — Telephone Encounter (Signed)
Tried to submit PA request for Michaela Gutierrez. Need updated insurance from patient.

## 2020-03-06 NOTE — Telephone Encounter (Signed)
Still can't find member, Museum/gallery conservator can you look into this one?

## 2020-03-07 NOTE — Telephone Encounter (Signed)
Submitted to plan

## 2020-03-07 NOTE — Telephone Encounter (Signed)
Levada Dy- Amber asked me to have you look into this one, insurance updated but still isn't finding her on covermymeds.

## 2020-03-08 ENCOUNTER — Telehealth: Payer: Self-pay

## 2020-03-08 NOTE — Telephone Encounter (Signed)
Routing to covering provider:  Pt left a vm msg earlier today regarding her Wegovy rx. Per pt, she has been taking Wegovy for 5 weeks. During her time in taking the rx, she was experiencing major anxiety breakthrough and was feeling that her anti-depressant meds were not working well because of the [employer] rx. Pt has decided not to continue to take [facility]. Pt did not state whether she wanted to try another weight loss rx.   She also mentioned that she tested positive for Covid yesterday. Requesting if provider can send in a small amount of ativan to help minimize her anxiety. Pls advise, thanks.

## 2020-03-08 NOTE — Telephone Encounter (Signed)
Okay.  We will go be discontinued from medication list.  Hopefully now that she is discontinued it her anxiety will improve but if not I would encourage her to make an appoint with Dr. Sheppard Coil sometime when she returns so that they can discuss whether or not her controller medications need to be adjusted.  We do not prescribe Ativan unless it is 1 pill for a procedure for sedation.  I am sorry to hear that she has COVID.  Encouraged her to stay home and quarantine rest and hydrate etc.  If she feels like she is not doing well we can I schedule her for a virtual visit if she would like.  We are also happy to refer her for with for therapy/counseling for anxiety as well if she feels like her symptoms are persisting now that she is off the [facility].

## 2020-03-08 NOTE — Telephone Encounter (Signed)
Task completed. Pt has been updated regarding provider's note. Pt is aware to contact the clinic if she is not getting better. Agreeable with plan. No other inquiries during the call.

## 2020-03-15 MED FILL — WEGOVY 1 MG/0.5ML SOAJ: 1 | 28 days supply | Qty: 2 | Fill #0

## 2020-03-15 NOTE — Telephone Encounter (Signed)
Prior authorization for Wegovy approved from 03/11/20 through 09/07/20. The authorization is effective for maximum of 6 fill(s), as long as patient is enrolled as a member of their current health plan.   The request is approved with the quantity limit of 2 ml per 28 days. Additional prior authorizations:  Wegovy 0.5 mg (#81448) Wegovy 1 mg (#[id number]) Wegovy 1.7 mg (#[id number]) Wegovy 2.4 mg (#[id number])  Michaela Gutierrez OP pharmacy has been updated.

## 2020-03-28 ENCOUNTER — Ambulatory Visit (AMBULATORY_SURGERY_CENTER): Payer: Self-pay | Admitting: *Deleted

## 2020-03-28 ENCOUNTER — Encounter: Payer: Self-pay | Admitting: Gastroenterology

## 2020-03-28 ENCOUNTER — Other Ambulatory Visit: Payer: Self-pay

## 2020-03-28 ENCOUNTER — Other Ambulatory Visit: Payer: Self-pay | Admitting: Gastroenterology

## 2020-03-28 VITALS — Ht 65.0 in | Wt 184.0 lb

## 2020-03-28 DIAGNOSIS — Z1211 Encounter for screening for malignant neoplasm of colon: Secondary | ICD-10-CM

## 2020-03-28 MED ORDER — SUPREP BOWEL PREP KIT 17.5-3.13-1.6 GM/177ML PO SOLN: 1.0000 | Freq: Once | ORAL | 0 refills | Status: DC

## 2020-03-28 MED FILL — SUPREP BOWEL PREP KIT: 17.5-3.13-1 | 1 days supply | Qty: 354 | Fill #0

## 2020-03-28 NOTE — Progress Notes (Signed)
No egg or soy allergy known to patient  No issues with past sedation with any surgeries or procedures No intubation problems in the past  No FH of Malignant Hyperthermia No diet pills per patient No home 02 use per patient  No blood thinners per patient  Pt denies issues with constipation daily- not chronic issue  No A fib or A flutter  EMMI video to pt or via [place] 19 guidelines implemented in PV today with Pt and RN  Pt is fully vaccinated  for Covid    Due to the COVID-19 pandemic we are asking patients to follow certain guidelines.  Pt aware of COVID protocols and [facility] guidelines

## 2020-04-01 DIAGNOSIS — M13841 Other specified arthritis, right hand: Secondary | ICD-10-CM | POA: Diagnosis not present

## 2020-04-01 DIAGNOSIS — M13842 Other specified arthritis, left hand: Secondary | ICD-10-CM | POA: Diagnosis not present

## 2020-04-08 MED FILL — SUPREP BOWEL PREP KIT: 17.5-3.13-1 | 1 days supply | Qty: 354 | Fill #0

## 2020-04-11 ENCOUNTER — Other Ambulatory Visit: Payer: Self-pay

## 2020-04-11 ENCOUNTER — Encounter: Payer: Self-pay | Admitting: Gastroenterology

## 2020-04-11 ENCOUNTER — Ambulatory Visit (AMBULATORY_SURGERY_CENTER): Payer: 59 | Admitting: Gastroenterology

## 2020-04-11 VITALS — BP 129/75 | HR 89 | Temp 97.1°F | Resp 17 | Ht 65.0 in | Wt 184.0 lb

## 2020-04-11 DIAGNOSIS — D123 Benign neoplasm of transverse colon: Secondary | ICD-10-CM

## 2020-04-11 DIAGNOSIS — D122 Benign neoplasm of ascending colon: Secondary | ICD-10-CM | POA: Diagnosis not present

## 2020-04-11 DIAGNOSIS — Z1211 Encounter for screening for malignant neoplasm of colon: Secondary | ICD-10-CM | POA: Diagnosis not present

## 2020-04-11 MED ORDER — SODIUM CHLORIDE 0.9 % IV SOLN: 500.0000 mL | Freq: Once | INTRAVENOUS | Status: DC

## 2020-04-11 NOTE — Progress Notes (Signed)
Vitals-JK  Pt's states no medical or surgical changes since previsit or office visit.  

## 2020-04-11 NOTE — Progress Notes (Signed)
Called to room to assist during endoscopic procedure.  Patient ID and intended procedure confirmed with present staff. Received instructions for my participation in the procedure from the performing physician.  

## 2020-04-11 NOTE — Op Note (Signed)
[facility] Patient Name: Michaela Gutierrez Procedure Date: 04/11/2020 8:58 AM MRN: [id number] Endoscopist: Jackquline Denmark , MD Age: 55 Referring MD:  Date of Birth: 09/06/1965 Gender: Female Account #: [id number] Procedure:                Colonoscopy Indications:              Screening for colorectal malignant neoplasm Medicines:                Monitored Anesthesia Care Procedure:                Pre-Anesthesia Assessment:                           - Prior to the procedure, a History and Physical                            was performed, and patient medications and                            allergies were reviewed. The patient's tolerance of                            previous anesthesia was also reviewed. The risks                            and benefits of the procedure and the sedation                            options and risks were discussed with the patient.                            All questions were answered, and informed consent                            was obtained. Prior Anticoagulants: The patient has                            taken no previous anticoagulant or antiplatelet                            agents. ASA Grade Assessment: II - A patient with                            mild systemic disease. After reviewing the risks                            and benefits, the patient was deemed in                            satisfactory condition to undergo the procedure.                           After obtaining informed consent, the colonoscope  was passed under direct vision. Throughout the                            procedure, the patient's blood pressure, pulse, and                            oxygen saturations were monitored continuously. The                            Olympus PFC-[id number] (#[id number]) Colonoscope was                            introduced through the anus and advanced to the 2                            cm into the ileum. The  colonoscopy was performed                            without difficulty. The patient tolerated the                            procedure well. The quality of the bowel                            preparation was good. The terminal ileum, ileocecal                            valve, appendiceal orifice, and rectum were                            photographed. Scope In: 9:04:24 AM Scope Out: 9:22:20 AM Scope Withdrawal Time: 0 hours 12 minutes 13 seconds  Total Procedure Duration: 0 hours 17 minutes 56 seconds  Findings:                 Two sessile polyps were found in the proximal                            transverse colon and proximal ascending colon. The                            polyps were 4 to 6 mm in size. These polyps were                            removed with a cold snare. Resection and retrieval                            were complete.                           Non-bleeding internal hemorrhoids were found during                            retroflexion. The hemorrhoids were small.  The terminal ileum appeared normal.                           The exam was otherwise without abnormality on                            direct and retroflexion views. Complications:            No immediate complications. Estimated Blood Loss:     Estimated blood loss: none. Impression:               - Two 4 to 6 mm polyps in the proximal transverse                            colon and in the proximal ascending colon, removed                            with a cold snare. Resected and retrieved.                           - Non-bleeding internal hemorrhoids.                           - The examined portion of the ileum was normal.                           - The examination was otherwise normal on direct                            and retroflexion views. Recommendation:           - Patient has a contact number available for                            emergencies. The signs and  symptoms of potential                            delayed complications were discussed with the                            patient. Return to normal activities tomorrow.                            Written discharge instructions were provided to the                            patient.                           - Resume previous diet.                           - Continue present medications.                           - Await pathology results.                           -  Repeat colonoscopy for surveillance based on                            pathology results.                           - The findings and recommendations were discussed                            with the patient's husband Michaela Gutierrez. Jackquline Denmark, MD 04/11/2020 9:26:43 AM This report has been signed electronically.

## 2020-04-11 NOTE — Progress Notes (Signed)
pt tolerated well. VSS. awake and to recovery. Report given to RN.  

## 2020-04-11 NOTE — Patient Instructions (Signed)
Resume previous diet Continue current medications Await pathology results  YOU HAD AN ENDOSCOPIC PROCEDURE TODAY AT THE [facility]:   Refer to the procedure report that was given to you for any specific questions about what was found during the examination.  If the procedure report does not answer your questions, please call your gastroenterologist to clarify.  If you requested that your care partner not be given the details of your procedure findings, then the procedure report has been included in a sealed envelope for you to review at your convenience later.  YOU SHOULD EXPECT: Some feelings of bloating in the abdomen. Passage of more gas than usual.  Walking can help get rid of the air that was put into your GI tract during the procedure and reduce the bloating. If you had a lower endoscopy (such as a colonoscopy or flexible sigmoidoscopy) you may notice spotting of blood in your stool or on the toilet paper. If you underwent a bowel prep for your procedure, you may not have a normal bowel movement for a few days.  Please Note:  You might notice some irritation and congestion in your nose or some drainage.  This is from the oxygen used during your procedure.  There is no need for concern and it should clear up in a day or so.  SYMPTOMS TO REPORT IMMEDIATELY:  Following lower endoscopy (colonoscopy or flexible sigmoidoscopy):  Excessive amounts of blood in the stool  Significant tenderness or worsening of abdominal pains  Swelling of the abdomen that is new, acute  Fever of 100F or higher  For urgent or emergent issues, a gastroenterologist can be reached at any hour by calling [phone]. Do not use MyChart messaging for urgent concerns.   DIET:  We do recommend a small meal at first, but then you may proceed to your regular diet.  Drink plenty of fluids but you should avoid alcoholic beverages for 24 hours.  ACTIVITY:  You should plan to take it easy for the rest of  today and you should NOT DRIVE or use heavy machinery until tomorrow (because of the sedation medicines used during the test).    FOLLOW UP: Our staff will call the number listed on your records 48-72 hours following your procedure to check on you and address any questions or concerns that you may have regarding the information given to you following your procedure. If we do not reach you, we will leave a message.  We will attempt to reach you two times.  During this call, we will ask if you have developed any symptoms of COVID 19. If you develop any symptoms (ie: fever, flu-like symptoms, shortness of breath, cough etc.) before then, please call [phone].  If you test positive for Covid 19 in the 2 weeks post procedure, please call and report this information to us.    If any biopsies were taken you will be contacted by phone or by letter within the next 1-3 weeks.  Please call us at [phone] if you have not heard about the biopsies in 3 weeks.   SIGNATURES/CONFIDENTIALITY: You and/or your care partner have signed paperwork which will be entered into your electronic medical record.  These signatures attest to the fact that that the information above on your After Visit Summary has been reviewed and is understood.  Full responsibility of the confidentiality of this discharge information lies with you and/or your care-partner.  

## 2020-04-15 ENCOUNTER — Telehealth: Payer: Self-pay

## 2020-04-15 NOTE — Telephone Encounter (Signed)
  Follow up Call-  Call back number 04/11/2020  Post procedure Call Back phone  # [phone]  Permission to leave phone message Yes  Some recent data might be hidden     Patient questions:  Do you have a fever, pain , or abdominal swelling? No. Pain Score  0 *  Have you tolerated food without any problems? Yes.    Have you been able to return to your normal activities? Yes.    Do you have any questions about your discharge instructions: Diet   No. Medications  No. Follow up visit  No.  Do you have questions or concerns about your Care? No.  Actions: * If pain score is 4 or above: No action needed, pain <4.  1. Have you developed a fever since your procedure? no  2.   Have you had an respiratory symptoms (SOB or cough) since your procedure? no  3.   Have you tested positive for COVID 19 since your procedure no  4.   Have you had any family members/close contacts diagnosed with the COVID 19 since your procedure?  no   If yes to any of these questions please route to Joylene John, RN and Joella Prince, RN

## 2020-04-21 ENCOUNTER — Encounter: Payer: Self-pay | Admitting: Gastroenterology

## 2020-04-25 DIAGNOSIS — F410 Panic disorder [episodic paroxysmal anxiety] without agoraphobia: Secondary | ICD-10-CM | POA: Diagnosis not present

## 2020-05-01 ENCOUNTER — Encounter: Payer: Self-pay | Admitting: Osteopathic Medicine

## 2020-05-03 ENCOUNTER — Other Ambulatory Visit: Payer: Self-pay | Admitting: Osteopathic Medicine

## 2020-05-03 MED ORDER — WEGOVY 0.5 MG/0.5ML ~~LOC~~ SOAJ: 0.5000 mg | SUBCUTANEOUS | 0 refills | Status: DC

## 2020-05-03 MED ORDER — WEGOVY 0.25 MG/0.5ML ~~LOC~~ SOAJ: 0.2500 mg | SUBCUTANEOUS | 0 refills | Status: DC

## 2020-05-03 MED ORDER — WEGOVY 1 MG/0.5ML ~~LOC~~ SOAJ: 1.0000 mg | SUBCUTANEOUS | 0 refills | Status: DC

## 2020-05-03 MED FILL — WEGOVY 0.25 MG/0.5ML SOAJ: 0.25 | 28 days supply | Qty: 2 | Fill #0

## 2020-05-21 DIAGNOSIS — S39012A Strain of muscle, fascia and tendon of lower back, initial encounter: Secondary | ICD-10-CM | POA: Diagnosis not present

## 2020-05-21 DIAGNOSIS — M62830 Muscle spasm of back: Secondary | ICD-10-CM | POA: Diagnosis not present

## 2020-05-21 DIAGNOSIS — M6281 Muscle weakness (generalized): Secondary | ICD-10-CM | POA: Diagnosis not present

## 2020-05-29 ENCOUNTER — Ambulatory Visit: Payer: 59 | Admitting: Osteopathic Medicine

## 2020-05-30 DIAGNOSIS — S39012A Strain of muscle, fascia and tendon of lower back, initial encounter: Secondary | ICD-10-CM | POA: Diagnosis not present

## 2020-05-30 DIAGNOSIS — M6281 Muscle weakness (generalized): Secondary | ICD-10-CM | POA: Diagnosis not present

## 2020-05-30 DIAGNOSIS — M62830 Muscle spasm of back: Secondary | ICD-10-CM | POA: Diagnosis not present

## 2020-06-03 DIAGNOSIS — M6281 Muscle weakness (generalized): Secondary | ICD-10-CM | POA: Diagnosis not present

## 2020-06-03 DIAGNOSIS — S39012A Strain of muscle, fascia and tendon of lower back, initial encounter: Secondary | ICD-10-CM | POA: Diagnosis not present

## 2020-06-03 DIAGNOSIS — M62830 Muscle spasm of back: Secondary | ICD-10-CM | POA: Diagnosis not present

## 2020-06-05 DIAGNOSIS — M6281 Muscle weakness (generalized): Secondary | ICD-10-CM | POA: Diagnosis not present

## 2020-06-05 DIAGNOSIS — M62830 Muscle spasm of back: Secondary | ICD-10-CM | POA: Diagnosis not present

## 2020-06-05 DIAGNOSIS — S39012A Strain of muscle, fascia and tendon of lower back, initial encounter: Secondary | ICD-10-CM | POA: Diagnosis not present

## 2020-06-10 DIAGNOSIS — S39012A Strain of muscle, fascia and tendon of lower back, initial encounter: Secondary | ICD-10-CM | POA: Diagnosis not present

## 2020-06-10 DIAGNOSIS — M62830 Muscle spasm of back: Secondary | ICD-10-CM | POA: Diagnosis not present

## 2020-06-10 DIAGNOSIS — M6281 Muscle weakness (generalized): Secondary | ICD-10-CM | POA: Diagnosis not present

## 2020-06-17 ENCOUNTER — Other Ambulatory Visit (HOSPITAL_COMMUNITY): Payer: Self-pay

## 2020-06-17 MED FILL — Semaglutide (Weight Mngmt) Soln Auto-Injector 1 MG/0.5ML: SUBCUTANEOUS | 28 days supply | Qty: 2 | Fill #0 | Status: AC

## 2020-06-19 ENCOUNTER — Ambulatory Visit: Payer: 59 | Admitting: Osteopathic Medicine

## 2020-07-15 ENCOUNTER — Encounter: Payer: Self-pay | Admitting: Osteopathic Medicine

## 2020-07-15 ENCOUNTER — Other Ambulatory Visit (HOSPITAL_COMMUNITY): Payer: Self-pay

## 2020-07-16 ENCOUNTER — Other Ambulatory Visit (HOSPITAL_COMMUNITY): Payer: Self-pay

## 2020-07-16 MED ORDER — WEGOVY 2.4 MG/0.75ML ~~LOC~~ SOAJ
2.4000 mg | SUBCUTANEOUS | 5 refills | Status: DC
  Filled 2020-07-16 – 2020-08-15 (×2): qty 3, 28d supply, fill #0

## 2020-07-16 MED ORDER — WEGOVY 1.7 MG/0.75ML ~~LOC~~ SOAJ
1.7000 mg | SUBCUTANEOUS | 0 refills | Status: AC
  Filled 2020-07-16: qty 3, 28d supply, fill #0

## 2020-07-19 ENCOUNTER — Other Ambulatory Visit (HOSPITAL_COMMUNITY): Payer: Self-pay

## 2020-08-15 ENCOUNTER — Other Ambulatory Visit (HOSPITAL_COMMUNITY): Payer: Self-pay

## 2020-08-21 ENCOUNTER — Other Ambulatory Visit (HOSPITAL_COMMUNITY): Payer: Self-pay

## 2020-08-21 ENCOUNTER — Encounter: Payer: Self-pay | Admitting: Osteopathic Medicine

## 2020-08-21 ENCOUNTER — Other Ambulatory Visit: Payer: Self-pay | Admitting: Osteopathic Medicine

## 2020-08-21 MED ORDER — WEGOVY 1.7 MG/0.75ML ~~LOC~~ SOAJ
1.7000 mg | SUBCUTANEOUS | 0 refills | Status: AC
  Filled 2020-08-21: qty 3, 28d supply, fill #0

## 2020-08-23 ENCOUNTER — Other Ambulatory Visit (HOSPITAL_COMMUNITY): Payer: Self-pay

## 2020-10-21 ENCOUNTER — Other Ambulatory Visit (HOSPITAL_COMMUNITY): Payer: Self-pay

## 2020-10-21 ENCOUNTER — Encounter: Payer: Self-pay | Admitting: Osteopathic Medicine

## 2020-10-21 ENCOUNTER — Other Ambulatory Visit: Payer: Self-pay | Admitting: Osteopathic Medicine

## 2020-10-21 MED ORDER — WEGOVY 2.4 MG/0.75ML ~~LOC~~ SOAJ
2.4000 mg | SUBCUTANEOUS | 0 refills | Status: DC
  Filled 2020-10-21 – 2020-11-08 (×6): qty 3, 28d supply, fill #0

## 2020-10-22 ENCOUNTER — Other Ambulatory Visit (HOSPITAL_COMMUNITY): Payer: Self-pay

## 2020-10-23 ENCOUNTER — Other Ambulatory Visit (HOSPITAL_COMMUNITY): Payer: Self-pay

## 2020-10-24 ENCOUNTER — Telehealth: Payer: Self-pay | Admitting: Osteopathic Medicine

## 2020-10-24 ENCOUNTER — Ambulatory Visit: Payer: 59 | Admitting: Osteopathic Medicine

## 2020-10-24 ENCOUNTER — Other Ambulatory Visit: Payer: Self-pay

## 2020-10-24 VITALS — BP 128/67 | HR 81 | Temp 97.7°F | Wt 176.0 lb

## 2020-10-24 DIAGNOSIS — R635 Abnormal weight gain: Secondary | ICD-10-CM | POA: Diagnosis not present

## 2020-10-24 DIAGNOSIS — Z1231 Encounter for screening mammogram for malignant neoplasm of breast: Secondary | ICD-10-CM | POA: Diagnosis not present

## 2020-10-24 NOTE — Telephone Encounter (Signed)
Can we check on prior auth submitted? Pt had visit today - Wegovy should be approved, successful w/ weight loss. Thanks!

## 2020-10-24 NOTE — Progress Notes (Signed)
Michaela Gutierrez is a 55 y.o. female who presents to  [place] at [facility]  today, 10/24/20, seeking care for the following:  FOllow up medically managed weight loss    Wt Readings  10/24/20 176 lb (79.8 kg) - Body mass index is 29.29 kg/m., down 20 lb since starting [facility]  04/11/20 184 lb (83.5 kg)  03/28/20 184 lb (83.5 kg)  01/29/20 196 lb 1.9 oz (89 kg) - had been on Wellbutrin, we started [facility]   03/11/18 195 lb (88.5 kg)    ASSESSMENT & PLAN with other pertinent findings:  The primary encounter diagnosis was Abnormal weight gain. A diagnosis of Breast cancer screening by mammogram was also pertinent to this visit.   Refilled Wegovy earlier this week She is doing well on this and noted successful weight loss and maintenance on this Rx, no adverse effects.   There are no Patient Instructions on file for this visit.  Orders Placed This Encounter  Procedures   MM 3D SCREEN BREAST BILATERAL     No orders of the defined types were placed in this encounter.    See below for relevant physical exam findings  See below for recent lab and imaging results reviewed  Medications, allergies, PMH, PSH, SocH, FamH reviewed below    Follow-up instructions: No follow-ups on file.                                        Exam:  BP 128/67 (BP Location: Left Arm, Patient Position: Sitting, Cuff Size: Normal)   Pulse 81   Temp 97.7 F (36.5 C) (Oral)   Wt 176 lb (79.8 kg)   BMI 29.29 kg/m  Constitutional: VS see above. General Appearance: alert, well-developed, well-nourished, NAD Neck: No masses, trachea midline.  Respiratory: Normal respiratory effort.  Musculoskeletal: Gait normal. Symmetric and independent movement of all extremities Neurological: Normal balance/coordination. No tremor. Skin: warm, dry, intact.  Psychiatric: Normal judgment/insight. Normal mood and affect. Oriented x3.    Current Meds  Medication Sig   buPROPion (WELLBUTRIN XL) 300 MG 24 hr tablet Take 300 mg by mouth daily.   clonazePAM (KLONOPIN) 0.5 MG tablet Take 0.5 mg by mouth 2 (two) times daily as needed.   estradiol (VIVELLE-DOT) 0.1 MG/24HR patch Place 1 patch (0.1 mg total) onto the skin 2 (two) times a week.   Na Sulfate-K Sulfate-Mg Sulf 17.5-3.13-1.6 GM/177ML SOLN TAKE 1 KIT BY MOUTH ONCE FOR 1 DOSE AS DIRECTED   Semaglutide-Weight Management (WEGOVY) 2.4 MG/0.75ML SOAJ Inject 2.4 mg into the skin once a week.   sertraline (ZOLOFT) 100 MG tablet Take 100 mg by mouth daily.   zolpidem (AMBIEN) 10 MG tablet Take 10 mg by mouth at bedtime as needed for sleep.    Allergies  Allergen Reactions   Semaglutide(0.25 Or 0.70m-Dos) Anxiety    Patient Active Problem List   Diagnosis Date Noted   History of hysterectomy 01/29/2020   Plantar fasciitis 05/03/2019   Osteoarthritis 10/27/2012   [place]'s nodes 10/27/2012   MDD (major depressive disorder) 05/26/2007   Generalized anxiety disorder 07/27/2006    Family History  Problem Relation Age of Onset   Diabetes Mother    Depression Mother    Anxiety disorder Mother    Arthritis Mother    Hypertension Mother    Prostate cancer Paternal Grandfather    Colon polyps Neg  Hx    Colon cancer Neg Hx    Esophageal cancer Neg Hx    Rectal cancer Neg Hx    Stomach cancer Neg Hx     Social History   Tobacco Use  Smoking Status Former   Types: Cigarettes  Smokeless Tobacco Never    Past Surgical History:  Procedure Laterality Date   ABDOMINAL HYSTERECTOMY     Broken bone repair     CESAREAN SECTION     x2    Immunization History  Administered Date(s) Administered   Influenza,inj,quad, With Preservative 11/24/2018   Influenza-Unspecified 12/18/2019   PFIZER(Purple Top)SARS-COV-2 Vaccination 02/21/2019, 03/13/2019   Pneumococcal Polysaccharide-23 12/08/2011   Tdap 12/08/2011   Unspecified SARS-COV-2 Vaccination 03/27/2019    Zoster Recombinat (Shingrix) 04/19/2017    No results found for this or any previous visit (from the past 2160 hour(s)).  No results found.     All questions at time of visit were answered - patient instructed to contact office with any additional concerns or updates. ER/RTC precautions were reviewed with the patient as applicable.   Please note: manual typing as well as voice recognition software may have been used to produce this document - typos may escape review. Please contact Dr. Sheppard Coil for any needed clarifications.

## 2020-10-25 ENCOUNTER — Other Ambulatory Visit (HOSPITAL_COMMUNITY): Payer: Self-pay

## 2020-10-30 ENCOUNTER — Other Ambulatory Visit (HOSPITAL_COMMUNITY): Payer: Self-pay

## 2020-10-30 NOTE — Telephone Encounter (Signed)
Medication: Semaglutide-Weight Management (WEGOVY) 2.4 MG/0.75ML SOAJ Prior authorization submitted via CoverMyMeds on 10/30/2020 PA submission pending Pt aware via MyChart

## 2020-10-31 ENCOUNTER — Other Ambulatory Visit (HOSPITAL_COMMUNITY): Payer: Self-pay

## 2020-11-01 ENCOUNTER — Other Ambulatory Visit (HOSPITAL_COMMUNITY): Payer: Self-pay

## 2020-11-06 ENCOUNTER — Other Ambulatory Visit (HOSPITAL_COMMUNITY): Payer: Self-pay

## 2020-11-08 ENCOUNTER — Other Ambulatory Visit (HOSPITAL_COMMUNITY): Payer: Self-pay

## 2020-11-22 ENCOUNTER — Other Ambulatory Visit: Payer: Self-pay

## 2020-12-02 ENCOUNTER — Other Ambulatory Visit (HOSPITAL_COMMUNITY): Payer: Self-pay

## 2020-12-02 ENCOUNTER — Other Ambulatory Visit: Payer: Self-pay | Admitting: Osteopathic Medicine

## 2020-12-02 MED ORDER — WEGOVY 2.4 MG/0.75ML ~~LOC~~ SOAJ
2.4000 mg | SUBCUTANEOUS | 0 refills | Status: DC
Start: 2020-12-02 — End: 2020-12-10
  Filled 2020-12-02: qty 3, 28d supply, fill #0

## 2020-12-02 NOTE — Telephone Encounter (Signed)
Rx was approved from 10/30/20-10/29/2021 Pt was made aware upon approval

## 2020-12-03 NOTE — Telephone Encounter (Signed)
Medication: Semaglutide-Weight Management (WEGOVY) 2.4 MG/0.75ML SOAJ Prior authorization determination received Medication has been approved Approval dates: 10/30/2020-10/29/2021  Patient aware via: Kimberly aware: Yes Provider aware via this encounter

## 2020-12-10 ENCOUNTER — Encounter: Payer: Self-pay | Admitting: Medical-Surgical

## 2020-12-10 ENCOUNTER — Other Ambulatory Visit (HOSPITAL_COMMUNITY): Payer: Self-pay

## 2020-12-10 MED ORDER — WEGOVY 1.7 MG/0.75ML ~~LOC~~ SOAJ
1.7000 mg | SUBCUTANEOUS | 1 refills | Status: DC
  Filled 2020-12-10 – 2021-03-13 (×4): qty 3, 28d supply, fill #0

## 2020-12-11 ENCOUNTER — Other Ambulatory Visit (HOSPITAL_COMMUNITY): Payer: Self-pay

## 2020-12-12 ENCOUNTER — Telehealth: Payer: Self-pay

## 2020-12-12 NOTE — Telephone Encounter (Signed)
Medication: Semaglutide-Weight Management (WEGOVY) 1.7 MG/0.75ML SOAJ Prior authorization submitted via CoverMyMeds on 12/12/2020 PA submission pending

## 2020-12-12 NOTE — Telephone Encounter (Signed)
Medication: Semaglutide-Weight Management (WEGOVY) 1.7 MG/0.75ML SOAJ Member has an active PA on file which is expiring on 10/29/2021 and has 11 no. of fills remaining.

## 2020-12-19 DIAGNOSIS — Z6825 Body mass index (BMI) 25.0-25.9, adult: Secondary | ICD-10-CM | POA: Diagnosis not present

## 2020-12-19 DIAGNOSIS — E669 Obesity, unspecified: Secondary | ICD-10-CM | POA: Diagnosis not present

## 2020-12-19 DIAGNOSIS — Z833 Family history of diabetes mellitus: Secondary | ICD-10-CM | POA: Diagnosis not present

## 2020-12-19 DIAGNOSIS — E663 Overweight: Secondary | ICD-10-CM | POA: Diagnosis not present

## 2020-12-19 DIAGNOSIS — E7800 Pure hypercholesterolemia, unspecified: Secondary | ICD-10-CM | POA: Diagnosis not present

## 2020-12-20 DIAGNOSIS — E669 Obesity, unspecified: Secondary | ICD-10-CM | POA: Diagnosis not present

## 2020-12-20 DIAGNOSIS — Z6825 Body mass index (BMI) 25.0-25.9, adult: Secondary | ICD-10-CM | POA: Diagnosis not present

## 2020-12-20 DIAGNOSIS — Z7689 Persons encountering health services in other specified circumstances: Secondary | ICD-10-CM | POA: Diagnosis not present

## 2021-01-20 ENCOUNTER — Telehealth: Payer: 59 | Admitting: Medical-Surgical

## 2021-01-21 ENCOUNTER — Other Ambulatory Visit (HOSPITAL_COMMUNITY): Payer: Self-pay

## 2021-01-21 MED ORDER — OZEMPIC (1 MG/DOSE) 4 MG/3ML ~~LOC~~ SOPN
1.0000 mg | PEN_INJECTOR | SUBCUTANEOUS | 1 refills | Status: DC
  Filled 2021-01-21: qty 3, 28d supply, fill #0
  Filled 2021-02-17: qty 3, 28d supply, fill #1

## 2021-01-28 ENCOUNTER — Ambulatory Visit (INDEPENDENT_AMBULATORY_CARE_PROVIDER_SITE_OTHER): Payer: Self-pay | Admitting: Medical-Surgical

## 2021-01-28 DIAGNOSIS — Z91199 Patient's noncompliance with other medical treatment and regimen due to unspecified reason: Secondary | ICD-10-CM

## 2021-01-28 NOTE — Progress Notes (Signed)
   Complete physical exam  Patient: Michaela Gutierrez   DOB: 12/13/1996   55 y.o. Female  MRN: [id number]  Subjective:    No chief complaint on file.   Michaela Gutierrez is a 55 y.o. female who presents today for a complete physical exam. She reports consuming a {diet types:17450} diet. {types:19826} She generally feels {DESC; WELL/FAIRLY WELL/POORLY:18703}. She reports sleeping {DESC; WELL/FAIRLY WELL/POORLY:18703}. She {does/does not:200015} have additional problems to discuss today.    Most recent fall risk assessment:    08/20/2021   10:42 AM  Fall Risk   Falls in the past year? 0  Number falls in past yr: 0  Injury with Fall? 0  Risk for fall due to : No Fall Risks  Follow up Falls evaluation completed     Most recent depression screenings:    08/20/2021   10:42 AM 07/11/2020   10:46 AM  PHQ 2/9 Scores  PHQ - 2 Score 0 0  PHQ- 9 Score 5     {VISON DENTAL STD PSA (Optional):27386}  {History (Optional):23778}  Patient Care Team: Lamario Mani, NP as PCP - General (Nurse Practitioner)   Outpatient Medications Prior to Visit  Medication Sig   fluticasone (FLONASE) 50 MCG/ACT nasal spray Place 2 sprays into both nostrils in the morning and at bedtime. After 7 days, reduce to once daily.   norgestimate-ethinyl estradiol (SPRINTEC 28) 0.25-35 MG-MCG tablet Take 1 tablet by mouth daily.   Nystatin POWD Apply liberally to affected area 2 times per day   spironolactone (ALDACTONE) 100 MG tablet Take 1 tablet (100 mg total) by mouth daily.   No facility-administered medications prior to visit.    ROS        Objective:     There were no vitals taken for this visit. {Vitals History (Optional):23777}  Physical Exam   No results found for any visits on 09/25/21. {Show previous labs (optional):23779}    Assessment & Plan:    Routine Health Maintenance and Physical Exam  Immunization History  Administered Date(s) Administered   DTaP 02/26/1999, 04/24/1999,  07/03/1999, 03/18/2000, 10/02/2003   Hepatitis A 07/29/2007, 08/03/2008   Hepatitis B 12/14/1998, 01/21/1999, 07/03/1999   HiB (PRP-OMP) 02/26/1999, 04/24/1999, 07/03/1999, 03/18/2000   IPV 02/26/1999, 04/24/1999, 12/22/1999, 10/02/2003   Influenza,inj,Quad PF,6+ Mos 11/03/2013   Influenza-Unspecified 02/03/2012   MMR 12/21/2000, 10/02/2003   Meningococcal Polysaccharide 08/03/2011   Pneumococcal Conjugate-13 03/18/2000   Pneumococcal-Unspecified 07/03/1999, 09/16/1999   Tdap 08/03/2011   Varicella 12/22/1999, 07/29/2007    Health Maintenance  Topic Date Due   HIV Screening  Never done   Hepatitis C Screening  Never done   INFLUENZA VACCINE  09/23/2021   PAP-Cervical Cytology Screening  09/25/2021 (Originally 12/13/2019)   PAP SMEAR-Modifier  09/25/2021 (Originally 12/13/2019)   TETANUS/TDAP  09/25/2021 (Originally 08/02/2021)   HPV VACCINES  Discontinued   COVID-19 Vaccine  Discontinued    Discussed health benefits of physical activity, and encouraged her to engage in regular exercise appropriate for her age and condition.  Problem List Items Addressed This Visit   None Visit Diagnoses     Annual physical exam    -  Primary   Cervical cancer screening       Need for Tdap vaccination          No follow-ups on file.     Skya Mccullum, NP   

## 2021-01-28 NOTE — Patient Instructions (Signed)
Preventive Care 92-53 Years Old, Female Preventive care refers to lifestyle choices and visits with your health care provider that can promote health and wellness. Preventive care visits are also called wellness exams. What can I expect for my preventive care visit? Counseling Your health care provider may ask you questions about your: Medical history, including: Past medical problems. Family medical history. Pregnancy history. Current health, including: Menstrual cycle. Method of birth control. Emotional well-being. Home life and relationship well-being. Sexual activity and sexual health. Lifestyle, including: Alcohol, nicotine or tobacco, and drug use. Access to firearms. Diet, exercise, and sleep habits. Work and work Statistician. Sunscreen use. Safety issues such as seatbelt and bike helmet use. Physical exam Your health care provider will check your: Height and weight. These may be used to calculate your BMI (body mass index). BMI is a measurement that tells if you are at a healthy weight. Waist circumference. This measures the distance around your waistline. This measurement also tells if you are at a healthy weight and may help predict your risk of certain diseases, such as type 2 diabetes and high blood pressure. Heart rate and blood pressure. Body temperature. Skin for abnormal spots. What immunizations do I need? Vaccines are usually given at various ages, according to a schedule. Your health care provider will recommend vaccines for you based on your age, medical history, and lifestyle or other factors, such as travel or where you work. What tests do I need? Screening Your health care provider may recommend screening tests for certain conditions. This may include: Lipid and cholesterol levels. Diabetes screening. This is done by checking your blood sugar (glucose) after you have not eaten for a while (fasting). Pelvic exam and Pap test. Hepatitis B test. Hepatitis C  test. HIV (human immunodeficiency virus) test. STI (sexually transmitted infection) testing, if you are at risk. Lung cancer screening. Colorectal cancer screening. Mammogram. Talk with your health care provider about when you should start having regular mammograms. This may depend on whether you have a family history of breast cancer. BRCA-related cancer screening. This may be done if you have a family history of breast, ovarian, tubal, or peritoneal cancers. Bone density scan. This is done to screen for osteoporosis. Talk with your health care provider about your test results, treatment options, and if necessary, the need for more tests. Follow these instructions at home: Eating and drinking  Eat a diet that includes fresh fruits and vegetables, whole grains, lean protein, and low-fat dairy products. Take vitamin and mineral supplements as recommended by your health care provider. Do not drink alcohol if: Your health care provider tells you not to drink. You are pregnant, may be pregnant, or are planning to become pregnant. If you drink alcohol: Limit how much you have to 0-1 drink a day. Know how much alcohol is in your drink. In the [place] one drink equals one 12 oz bottle of beer (355 mL), one 5 oz glass of wine (148 mL), or one 1 oz glass of hard liquor (44 mL). Lifestyle Brush your teeth every morning and night with fluoride toothpaste. Floss one time each day. Exercise for at least 30 minutes 5 or more days each week. Do not use any products that contain nicotine or tobacco. These products include cigarettes, chewing tobacco, and vaping devices, such as e-cigarettes. If you need help quitting, ask your health care provider. Do not use drugs. If you are sexually active, practice safe sex. Use a condom or other form of protection to prevent  STIs. If you do not wish to become pregnant, use a form of birth control. If you plan to become pregnant, see your health care provider for a  prepregnancy visit. Take aspirin only as told by your health care provider. Make sure that you understand how much to take and what form to take. Work with your health care provider to find out whether it is safe and beneficial for you to take aspirin daily. Find healthy ways to manage stress, such as: Meditation, yoga, or listening to music. Journaling. Talking to a trusted person. Spending time with friends and family. Minimize exposure to UV radiation to reduce your risk of skin cancer. Safety Always wear your seat belt while driving or riding in a vehicle. Do not drive: If you have been drinking alcohol. Do not ride with someone who has been drinking. When you are tired or distracted. While texting. If you have been using any mind-altering substances or drugs. Wear a helmet and other protective equipment during sports activities. If you have firearms in your house, make sure you follow all gun safety procedures. Seek help if you have been physically or sexually abused. What's next? Visit your health care provider once a year for an annual wellness visit. Ask your health care provider how often you should have your eyes and teeth checked. Stay up to date on all vaccines. This information is not intended to replace advice given to you by your health care provider. Make sure you discuss any questions you have with your health care provider. Document Revised: 08/07/2020 Document Reviewed: 08/07/2020 Elsevier Patient Education  [employer].

## 2021-02-18 ENCOUNTER — Other Ambulatory Visit (HOSPITAL_COMMUNITY): Payer: Self-pay

## 2021-02-21 ENCOUNTER — Other Ambulatory Visit (HOSPITAL_COMMUNITY): Payer: Self-pay

## 2021-02-21 DIAGNOSIS — Z6830 Body mass index (BMI) 30.0-30.9, adult: Secondary | ICD-10-CM | POA: Diagnosis not present

## 2021-02-21 DIAGNOSIS — Z713 Dietary counseling and surveillance: Secondary | ICD-10-CM | POA: Diagnosis not present

## 2021-02-21 MED ORDER — OZEMPIC (2 MG/DOSE) 8 MG/3ML ~~LOC~~ SOPN
2.0000 mg | PEN_INJECTOR | SUBCUTANEOUS | 0 refills | Status: DC
  Filled 2021-02-21 (×2): qty 3, 28d supply, fill #0

## 2021-03-13 ENCOUNTER — Other Ambulatory Visit (HOSPITAL_COMMUNITY): Payer: Self-pay

## 2021-03-14 ENCOUNTER — Other Ambulatory Visit (HOSPITAL_COMMUNITY): Payer: Self-pay

## 2021-03-14 MED ORDER — WEGOVY 1.7 MG/0.75ML ~~LOC~~ SOAJ
1.7000 mg | SUBCUTANEOUS | 0 refills | Status: DC
  Filled 2021-03-14: qty 3, 28d supply, fill #0

## 2021-04-07 ENCOUNTER — Other Ambulatory Visit (HOSPITAL_COMMUNITY): Payer: Self-pay

## 2021-04-07 MED ORDER — WEGOVY 2.4 MG/0.75ML ~~LOC~~ SOAJ
2.4000 mg | SUBCUTANEOUS | 0 refills | Status: DC
  Filled 2021-04-07: qty 3, 28d supply, fill #0

## 2021-05-09 ENCOUNTER — Other Ambulatory Visit (HOSPITAL_COMMUNITY): Payer: Self-pay

## 2021-05-09 MED ORDER — WEGOVY 2.4 MG/0.75ML ~~LOC~~ SOAJ
2.4000 mg | SUBCUTANEOUS | 1 refills | Status: DC
  Filled 2021-05-09: qty 3, 28d supply, fill #0
  Filled 2021-06-15: qty 3, 28d supply, fill #1

## 2021-06-16 ENCOUNTER — Other Ambulatory Visit (HOSPITAL_COMMUNITY): Payer: Self-pay

## 2021-06-18 ENCOUNTER — Other Ambulatory Visit (HOSPITAL_COMMUNITY): Payer: Self-pay

## 2021-08-06 ENCOUNTER — Other Ambulatory Visit (HOSPITAL_COMMUNITY): Payer: Self-pay

## 2021-08-06 MED ORDER — WEGOVY 2.4 MG/0.75ML ~~LOC~~ SOAJ
2.4000 mg | SUBCUTANEOUS | 0 refills | Status: DC
  Filled 2021-08-06: qty 3, 28d supply, fill #0

## 2021-08-11 ENCOUNTER — Other Ambulatory Visit (HOSPITAL_COMMUNITY): Payer: Self-pay

## 2021-08-11 MED ORDER — SERTRALINE HCL 100 MG PO TABS
150.0000 mg | ORAL_TABLET | Freq: Every morning | ORAL | 1 refills | Status: DC
  Filled 2021-08-11: qty 135, 90d supply, fill #0
  Filled 2021-11-18: qty 135, 90d supply, fill #1

## 2021-08-11 MED ORDER — BUPROPION HCL ER (XL) 150 MG PO TB24
150.0000 mg | ORAL_TABLET | Freq: Every morning | ORAL | 1 refills | Status: DC
  Filled 2021-08-11: qty 90, 90d supply, fill #0
  Filled 2021-11-18: qty 90, 90d supply, fill #1

## 2021-08-15 ENCOUNTER — Other Ambulatory Visit (HOSPITAL_COMMUNITY): Payer: Self-pay

## 2021-08-15 MED ORDER — CELECOXIB 200 MG PO CAPS
200.0000 mg | ORAL_CAPSULE | Freq: Every day | ORAL | 2 refills | Status: DC
  Filled 2021-08-15: qty 90, 90d supply, fill #0
  Filled 2021-11-18: qty 90, 90d supply, fill #1
  Filled 2022-02-27: qty 90, 90d supply, fill #2

## 2021-09-02 ENCOUNTER — Other Ambulatory Visit (HOSPITAL_COMMUNITY): Payer: Self-pay

## 2021-09-15 ENCOUNTER — Other Ambulatory Visit (HOSPITAL_COMMUNITY): Payer: Self-pay

## 2021-09-15 MED ORDER — WEGOVY 2.4 MG/0.75ML ~~LOC~~ SOAJ
2.4000 mg | SUBCUTANEOUS | 0 refills | Status: DC
  Filled 2021-09-15: qty 3, 28d supply, fill #0

## 2021-10-09 ENCOUNTER — Other Ambulatory Visit (HOSPITAL_COMMUNITY): Payer: Self-pay

## 2021-10-09 MED ORDER — WEGOVY 1 MG/0.5ML ~~LOC~~ SOAJ
1.0000 mg | SUBCUTANEOUS | 0 refills | Status: DC
  Filled 2021-10-09: qty 2, 28d supply, fill #0

## 2021-10-13 ENCOUNTER — Other Ambulatory Visit (HOSPITAL_COMMUNITY): Payer: Self-pay

## 2021-10-13 MED ORDER — WEGOVY 2.4 MG/0.75ML ~~LOC~~ SOAJ
2.4000 mg | SUBCUTANEOUS | 0 refills | Status: DC
Start: 2021-10-13 — End: 2021-12-15
  Filled 2021-10-13 – 2021-10-24 (×2): qty 3, 28d supply, fill #0

## 2021-10-23 ENCOUNTER — Other Ambulatory Visit (HOSPITAL_COMMUNITY): Payer: Self-pay

## 2021-10-24 ENCOUNTER — Other Ambulatory Visit (HOSPITAL_COMMUNITY): Payer: Self-pay

## 2021-10-24 MED ORDER — WEGOVY 1 MG/0.5ML ~~LOC~~ SOAJ
1.0000 mg | SUBCUTANEOUS | 3 refills | Status: DC
  Filled 2021-10-24: qty 2, 28d supply, fill #0

## 2021-10-24 MED ORDER — ESTRADIOL 0.1 MG/GM VA CREA
TOPICAL_CREAM | VAGINAL | 0 refills | Status: DC
  Filled 2021-10-24: qty 42.5, 90d supply, fill #0

## 2021-11-18 ENCOUNTER — Other Ambulatory Visit (HOSPITAL_COMMUNITY): Payer: Self-pay

## 2021-11-18 MED ORDER — ESTRADIOL 0.05 MG/24HR TD PTWK
0.0500 mg | MEDICATED_PATCH | TRANSDERMAL | 0 refills | Status: DC
Start: 2021-11-18 — End: 2021-12-22
  Filled 2021-11-18: qty 4, 28d supply, fill #0

## 2021-12-12 ENCOUNTER — Other Ambulatory Visit (HOSPITAL_COMMUNITY): Payer: Self-pay

## 2021-12-15 ENCOUNTER — Other Ambulatory Visit (HOSPITAL_COMMUNITY): Payer: Self-pay

## 2021-12-15 MED ORDER — WEGOVY 2.4 MG/0.75ML ~~LOC~~ SOAJ
2.4000 mg | SUBCUTANEOUS | 5 refills | Status: DC
  Filled 2021-12-15: qty 3, 28d supply, fill #0
  Filled 2022-01-22 – 2022-03-06 (×2): qty 3, 28d supply, fill #1

## 2021-12-22 ENCOUNTER — Other Ambulatory Visit (HOSPITAL_COMMUNITY): Payer: Self-pay

## 2021-12-22 MED ORDER — ESTRADIOL 0.05 MG/24HR TD PTWK
0.0500 mg | MEDICATED_PATCH | TRANSDERMAL | 1 refills | Status: DC
  Filled 2021-12-22: qty 4, 28d supply, fill #0

## 2021-12-23 ENCOUNTER — Other Ambulatory Visit (HOSPITAL_COMMUNITY): Payer: Self-pay

## 2022-01-22 ENCOUNTER — Other Ambulatory Visit (HOSPITAL_COMMUNITY): Payer: Self-pay

## 2022-01-22 MED ORDER — WEGOVY 2.4 MG/0.75ML ~~LOC~~ SOAJ
2.4000 mg | SUBCUTANEOUS | 6 refills | Status: DC
  Filled 2022-01-22: qty 3, 28d supply, fill #0

## 2022-01-23 ENCOUNTER — Other Ambulatory Visit (HOSPITAL_COMMUNITY): Payer: Self-pay

## 2022-02-27 ENCOUNTER — Other Ambulatory Visit (HOSPITAL_COMMUNITY): Payer: Self-pay

## 2022-03-02 ENCOUNTER — Other Ambulatory Visit (HOSPITAL_COMMUNITY): Payer: Self-pay

## 2022-03-03 ENCOUNTER — Other Ambulatory Visit (HOSPITAL_COMMUNITY): Payer: Self-pay

## 2022-03-03 MED ORDER — SERTRALINE HCL 100 MG PO TABS
150.0000 mg | ORAL_TABLET | Freq: Every morning | ORAL | 0 refills | Status: DC
  Filled 2022-03-03: qty 135, 90d supply, fill #0

## 2022-03-03 MED ORDER — BUPROPION HCL ER (XL) 150 MG PO TB24
150.0000 mg | ORAL_TABLET | Freq: Every morning | ORAL | 0 refills | Status: DC
  Filled 2022-03-03: qty 90, 90d supply, fill #0

## 2022-03-04 ENCOUNTER — Other Ambulatory Visit: Payer: Self-pay

## 2022-03-06 ENCOUNTER — Other Ambulatory Visit (HOSPITAL_COMMUNITY): Payer: Self-pay

## 2022-03-11 ENCOUNTER — Other Ambulatory Visit (HOSPITAL_COMMUNITY): Payer: Self-pay

## 2022-03-11 MED ORDER — ZEPBOUND 5 MG/0.5ML ~~LOC~~ SOAJ
5.0000 mg | SUBCUTANEOUS | 0 refills | Status: DC
  Filled 2022-03-11: qty 2, 28d supply, fill #0

## 2022-03-12 ENCOUNTER — Other Ambulatory Visit (HOSPITAL_COMMUNITY): Payer: Self-pay

## 2022-03-13 ENCOUNTER — Other Ambulatory Visit (HOSPITAL_COMMUNITY): Payer: Self-pay

## 2022-03-18 ENCOUNTER — Other Ambulatory Visit (HOSPITAL_COMMUNITY): Payer: Self-pay

## 2022-03-18 ENCOUNTER — Other Ambulatory Visit: Payer: Self-pay

## 2022-03-19 ENCOUNTER — Other Ambulatory Visit (HOSPITAL_COMMUNITY): Payer: Self-pay

## 2022-03-30 ENCOUNTER — Other Ambulatory Visit (HOSPITAL_COMMUNITY): Payer: Self-pay

## 2022-03-30 MED ORDER — AMOXICILLIN 500 MG PO CAPS
500.0000 mg | ORAL_CAPSULE | Freq: Four times a day (QID) | ORAL | 0 refills | Status: DC
  Filled 2022-03-30: qty 28, 7d supply, fill #0

## 2022-04-08 ENCOUNTER — Other Ambulatory Visit (HOSPITAL_COMMUNITY): Payer: Self-pay

## 2022-04-08 MED ORDER — CLONAZEPAM 0.5 MG PO TABS
0.5000 mg | ORAL_TABLET | Freq: Two times a day (BID) | ORAL | 0 refills | Status: DC | PRN
  Filled 2022-04-08: qty 90, 30d supply, fill #0

## 2022-04-09 ENCOUNTER — Other Ambulatory Visit (HOSPITAL_COMMUNITY): Payer: Self-pay

## 2022-04-09 MED ORDER — ZEPBOUND 5 MG/0.5ML ~~LOC~~ SOAJ
5.0000 mg | SUBCUTANEOUS | 2 refills | Status: DC
  Filled 2022-04-09 – 2022-05-01 (×3): qty 2, 28d supply, fill #0

## 2022-04-10 ENCOUNTER — Other Ambulatory Visit (HOSPITAL_COMMUNITY): Payer: Self-pay

## 2022-04-21 ENCOUNTER — Other Ambulatory Visit (HOSPITAL_BASED_OUTPATIENT_CLINIC_OR_DEPARTMENT_OTHER): Payer: Self-pay

## 2022-04-23 ENCOUNTER — Other Ambulatory Visit (HOSPITAL_BASED_OUTPATIENT_CLINIC_OR_DEPARTMENT_OTHER): Payer: Self-pay

## 2022-04-23 ENCOUNTER — Other Ambulatory Visit: Payer: Self-pay

## 2022-05-01 ENCOUNTER — Other Ambulatory Visit (HOSPITAL_COMMUNITY): Payer: Self-pay

## 2022-05-01 MED ORDER — ZEPBOUND 10 MG/0.5ML ~~LOC~~ SOAJ
10.0000 mg | SUBCUTANEOUS | 0 refills | Status: DC
  Filled 2022-05-01: qty 2, 28d supply, fill #0

## 2022-05-01 MED ORDER — ZEPBOUND 5 MG/0.5ML ~~LOC~~ SOAJ
5.0000 mg | SUBCUTANEOUS | 0 refills | Status: DC
  Filled 2022-05-01 – 2022-05-04 (×3): qty 2, 28d supply, fill #0

## 2022-05-01 MED ORDER — ZEPBOUND 10 MG/0.5ML ~~LOC~~ SOAJ
10.0000 mg | SUBCUTANEOUS | 0 refills | Status: DC
  Filled 2022-05-01 – 2022-05-14 (×3): qty 2, 28d supply, fill #0
  Filled 2022-05-20: qty 6.5, 91d supply, fill #0
  Filled 2022-07-06: qty 2, 28d supply, fill #0

## 2022-05-04 ENCOUNTER — Other Ambulatory Visit (HOSPITAL_BASED_OUTPATIENT_CLINIC_OR_DEPARTMENT_OTHER): Payer: Self-pay

## 2022-05-04 ENCOUNTER — Other Ambulatory Visit (HOSPITAL_COMMUNITY): Payer: Self-pay

## 2022-05-11 ENCOUNTER — Other Ambulatory Visit (HOSPITAL_BASED_OUTPATIENT_CLINIC_OR_DEPARTMENT_OTHER): Payer: Self-pay

## 2022-05-11 ENCOUNTER — Encounter (HOSPITAL_BASED_OUTPATIENT_CLINIC_OR_DEPARTMENT_OTHER): Payer: Self-pay

## 2022-05-13 ENCOUNTER — Other Ambulatory Visit (HOSPITAL_BASED_OUTPATIENT_CLINIC_OR_DEPARTMENT_OTHER): Payer: Self-pay

## 2022-05-14 ENCOUNTER — Other Ambulatory Visit (HOSPITAL_BASED_OUTPATIENT_CLINIC_OR_DEPARTMENT_OTHER): Payer: Self-pay

## 2022-05-15 ENCOUNTER — Other Ambulatory Visit (HOSPITAL_BASED_OUTPATIENT_CLINIC_OR_DEPARTMENT_OTHER): Payer: Self-pay

## 2022-05-15 ENCOUNTER — Other Ambulatory Visit (HOSPITAL_COMMUNITY): Payer: Self-pay

## 2022-05-15 MED ORDER — CLONAZEPAM 0.5 MG PO TABS
0.5000 mg | ORAL_TABLET | Freq: Two times a day (BID) | ORAL | 0 refills | Status: DC
  Filled 2022-05-15: qty 90, 30d supply, fill #0

## 2022-05-16 ENCOUNTER — Other Ambulatory Visit (HOSPITAL_BASED_OUTPATIENT_CLINIC_OR_DEPARTMENT_OTHER): Payer: Self-pay

## 2022-05-19 ENCOUNTER — Other Ambulatory Visit: Payer: Self-pay | Admitting: Osteopathic Medicine

## 2022-05-19 ENCOUNTER — Other Ambulatory Visit (HOSPITAL_COMMUNITY): Payer: Self-pay

## 2022-05-20 ENCOUNTER — Other Ambulatory Visit (HOSPITAL_COMMUNITY): Payer: Self-pay

## 2022-05-20 ENCOUNTER — Other Ambulatory Visit (HOSPITAL_BASED_OUTPATIENT_CLINIC_OR_DEPARTMENT_OTHER): Payer: Self-pay

## 2022-05-20 MED ORDER — ZEPBOUND 7.5 MG/0.5ML ~~LOC~~ SOAJ
7.5000 mg | SUBCUTANEOUS | 1 refills | Status: DC
  Filled 2022-05-20 – 2022-05-29 (×3): qty 2, 28d supply, fill #0
  Filled 2022-06-21 – 2022-07-06 (×3): qty 2, 28d supply, fill #1

## 2022-05-26 ENCOUNTER — Other Ambulatory Visit (HOSPITAL_COMMUNITY): Payer: Self-pay

## 2022-05-26 ENCOUNTER — Other Ambulatory Visit (HOSPITAL_BASED_OUTPATIENT_CLINIC_OR_DEPARTMENT_OTHER): Payer: Self-pay

## 2022-05-26 ENCOUNTER — Other Ambulatory Visit: Payer: Self-pay

## 2022-05-26 MED ORDER — BUPROPION HCL ER (XL) 150 MG PO TB24
150.0000 mg | ORAL_TABLET | Freq: Every morning | ORAL | 0 refills | Status: DC
  Filled 2022-05-26: qty 90, 90d supply, fill #0

## 2022-05-27 ENCOUNTER — Other Ambulatory Visit (HOSPITAL_BASED_OUTPATIENT_CLINIC_OR_DEPARTMENT_OTHER): Payer: Self-pay

## 2022-05-27 MED ORDER — DEXAMETHASONE 4 MG PO TABS
4.0000 mg | ORAL_TABLET | Freq: Three times a day (TID) | ORAL | 0 refills | Status: DC
  Filled 2022-05-27: qty 9, 3d supply, fill #0

## 2022-05-27 MED ORDER — CHLORHEXIDINE GLUCONATE 0.12 % MT SOLN
OROMUCOSAL | 0 refills | Status: DC
  Filled 2022-05-27: qty 437, 7d supply, fill #0

## 2022-05-27 MED ORDER — AMOXICILLIN 500 MG PO CAPS
500.0000 mg | ORAL_CAPSULE | Freq: Three times a day (TID) | ORAL | 0 refills | Status: DC
  Filled 2022-05-27: qty 9, 3d supply, fill #0

## 2022-05-27 MED ORDER — CELECOXIB 200 MG PO CAPS
200.0000 mg | ORAL_CAPSULE | Freq: Every day | ORAL | 2 refills | Status: DC
  Filled 2022-05-27: qty 90, 90d supply, fill #0
  Filled 2022-08-28: qty 17, 17d supply, fill #1
  Filled 2022-08-28: qty 73, 73d supply, fill #1
  Filled 2022-08-28: qty 17, 17d supply, fill #1
  Filled 2022-08-28: qty 73, 73d supply, fill #1
  Filled 2022-11-30: qty 90, 90d supply, fill #2

## 2022-05-27 MED ORDER — IBUPROFEN 400 MG PO TABS
400.0000 mg | ORAL_TABLET | ORAL | 0 refills | Status: DC | PRN
  Filled 2022-05-27: qty 30, 5d supply, fill #0

## 2022-05-27 MED ORDER — ACETAMINOPHEN 500 MG PO TABS
1000.0000 mg | ORAL_TABLET | Freq: Four times a day (QID) | ORAL | 0 refills | Status: AC | PRN
  Filled 2022-05-27: qty 56, 7d supply, fill #0

## 2022-05-28 ENCOUNTER — Other Ambulatory Visit (HOSPITAL_BASED_OUTPATIENT_CLINIC_OR_DEPARTMENT_OTHER): Payer: Self-pay

## 2022-05-29 ENCOUNTER — Other Ambulatory Visit (HOSPITAL_BASED_OUTPATIENT_CLINIC_OR_DEPARTMENT_OTHER): Payer: Self-pay

## 2022-06-21 ENCOUNTER — Other Ambulatory Visit (HOSPITAL_BASED_OUTPATIENT_CLINIC_OR_DEPARTMENT_OTHER): Payer: Self-pay

## 2022-06-22 ENCOUNTER — Other Ambulatory Visit (HOSPITAL_BASED_OUTPATIENT_CLINIC_OR_DEPARTMENT_OTHER): Payer: Self-pay

## 2022-07-06 ENCOUNTER — Other Ambulatory Visit (HOSPITAL_BASED_OUTPATIENT_CLINIC_OR_DEPARTMENT_OTHER): Payer: Self-pay

## 2022-07-06 ENCOUNTER — Other Ambulatory Visit (HOSPITAL_COMMUNITY): Payer: Self-pay

## 2022-07-06 ENCOUNTER — Other Ambulatory Visit: Payer: Self-pay

## 2022-07-06 MED ORDER — CLONAZEPAM 0.5 MG PO TABS
0.5000 mg | ORAL_TABLET | Freq: Two times a day (BID) | ORAL | 0 refills | Status: DC
  Filled 2022-07-06: qty 90, 30d supply, fill #0

## 2022-07-07 ENCOUNTER — Other Ambulatory Visit (HOSPITAL_BASED_OUTPATIENT_CLINIC_OR_DEPARTMENT_OTHER): Payer: Self-pay

## 2022-07-07 ENCOUNTER — Other Ambulatory Visit (HOSPITAL_COMMUNITY): Payer: Self-pay

## 2022-07-08 ENCOUNTER — Other Ambulatory Visit (HOSPITAL_BASED_OUTPATIENT_CLINIC_OR_DEPARTMENT_OTHER): Payer: Self-pay

## 2022-07-14 ENCOUNTER — Other Ambulatory Visit (HOSPITAL_BASED_OUTPATIENT_CLINIC_OR_DEPARTMENT_OTHER): Payer: Self-pay

## 2022-07-15 ENCOUNTER — Other Ambulatory Visit (HOSPITAL_BASED_OUTPATIENT_CLINIC_OR_DEPARTMENT_OTHER): Payer: Self-pay

## 2022-07-15 DIAGNOSIS — Z5181 Encounter for therapeutic drug level monitoring: Secondary | ICD-10-CM | POA: Diagnosis not present

## 2022-07-15 DIAGNOSIS — F410 Panic disorder [episodic paroxysmal anxiety] without agoraphobia: Secondary | ICD-10-CM | POA: Diagnosis not present

## 2022-07-15 MED ORDER — CLONAZEPAM 0.5 MG PO TABS: 0.5000 mg | ORAL_TABLET | Freq: Two times a day (BID) | ORAL | 0 refills | Status: DC

## 2022-07-15 MED ORDER — CLONAZEPAM 0.5 MG PO TABS: 0.5000 mg | ORAL_TABLET | Freq: Two times a day (BID) | ORAL | 5 refills | Status: DC

## 2022-07-15 MED ORDER — CLONAZEPAM 0.5 MG PO TABS
0.5000 mg | ORAL_TABLET | Freq: Two times a day (BID) | ORAL | 5 refills | Status: DC
  Filled 2022-08-28: qty 60, 30d supply, fill #0
  Filled 2022-10-04: qty 60, 30d supply, fill #1
  Filled 2022-11-13: qty 60, 30d supply, fill #2
  Filled 2022-12-14: qty 60, 30d supply, fill #3

## 2022-07-16 ENCOUNTER — Other Ambulatory Visit (HOSPITAL_BASED_OUTPATIENT_CLINIC_OR_DEPARTMENT_OTHER): Payer: Self-pay

## 2022-07-24 ENCOUNTER — Other Ambulatory Visit (HOSPITAL_BASED_OUTPATIENT_CLINIC_OR_DEPARTMENT_OTHER): Payer: Self-pay

## 2022-07-31 ENCOUNTER — Other Ambulatory Visit (HOSPITAL_BASED_OUTPATIENT_CLINIC_OR_DEPARTMENT_OTHER): Payer: Self-pay

## 2022-08-28 ENCOUNTER — Other Ambulatory Visit (HOSPITAL_BASED_OUTPATIENT_CLINIC_OR_DEPARTMENT_OTHER): Payer: Self-pay

## 2022-08-28 ENCOUNTER — Other Ambulatory Visit: Payer: Self-pay

## 2022-08-31 ENCOUNTER — Other Ambulatory Visit (HOSPITAL_BASED_OUTPATIENT_CLINIC_OR_DEPARTMENT_OTHER): Payer: Self-pay

## 2022-08-31 MED ORDER — SERTRALINE HCL 100 MG PO TABS
150.0000 mg | ORAL_TABLET | Freq: Every morning | ORAL | 1 refills | Status: AC
  Filled 2022-08-31: qty 135, 90d supply, fill #0
  Filled 2022-11-30: qty 135, 90d supply, fill #1

## 2022-08-31 MED ORDER — BUPROPION HCL ER (XL) 150 MG PO TB24
ORAL_TABLET | ORAL | 1 refills | Status: DC
  Filled 2022-08-31: qty 90, 90d supply, fill #0
  Filled 2022-11-30: qty 90, 90d supply, fill #1

## 2022-09-02 ENCOUNTER — Other Ambulatory Visit: Payer: Self-pay | Admitting: Oncology

## 2022-09-02 DIAGNOSIS — Z006 Encounter for examination for normal comparison and control in clinical research program: Secondary | ICD-10-CM

## 2022-09-05 ENCOUNTER — Other Ambulatory Visit (HOSPITAL_BASED_OUTPATIENT_CLINIC_OR_DEPARTMENT_OTHER): Payer: Self-pay

## 2022-10-05 ENCOUNTER — Other Ambulatory Visit: Payer: Self-pay

## 2022-11-13 ENCOUNTER — Other Ambulatory Visit: Payer: Self-pay

## 2022-11-18 ENCOUNTER — Ambulatory Visit (INDEPENDENT_AMBULATORY_CARE_PROVIDER_SITE_OTHER): Payer: 59 | Admitting: Family

## 2022-11-18 ENCOUNTER — Encounter: Payer: Self-pay | Admitting: Family

## 2022-11-18 VITALS — BP 120/67 | HR 80 | Temp 98.0°F | Ht 65.0 in | Wt 181.5 lb

## 2022-11-18 DIAGNOSIS — Z1159 Encounter for screening for other viral diseases: Secondary | ICD-10-CM | POA: Diagnosis not present

## 2022-11-18 DIAGNOSIS — Z780 Asymptomatic menopausal state: Secondary | ICD-10-CM

## 2022-11-18 DIAGNOSIS — Z0000 Encounter for general adult medical examination without abnormal findings: Secondary | ICD-10-CM | POA: Diagnosis not present

## 2022-11-18 DIAGNOSIS — Z114 Encounter for screening for human immunodeficiency virus [HIV]: Secondary | ICD-10-CM

## 2022-11-18 LAB — COMPREHENSIVE METABOLIC PANEL
ALT: 29 U/L (ref 0–35)
AST: 21 U/L (ref 0–37)
Albumin: 4.3 g/dL (ref 3.5–5.2)
Alkaline Phosphatase: 63 U/L (ref 39–117)
BUN: 16 mg/dL (ref 6–23)
CO2: 29 mEq/L (ref 19–32)
Calcium: 9.5 mg/dL (ref 8.4–10.5)
Chloride: 102 mEq/L (ref 96–112)
Creatinine, Ser: 0.71 mg/dL (ref 0.40–1.20)
GFR: 95.61 mL/min (ref >60.00)
Glucose, Bld: 84 mg/dL (ref 70–99)
Potassium: 3.9 mEq/L (ref 3.5–5.1)
Sodium: 138 mEq/L (ref 135–145)
Total Bilirubin: 0.3 mg/dL (ref 0.2–1.2)
Total Protein: 7.3 g/dL (ref 6.0–8.3)

## 2022-11-18 LAB — CBC WITH DIFFERENTIAL/PLATELET
Basophils Absolute: 0.1 10*3/uL (ref 0.0–0.1)
Basophils Relative: 0.7 % (ref 0.0–3.0)
Eosinophils Absolute: 0.2 10*3/uL (ref 0.0–0.7)
Eosinophils Relative: 2.7 % (ref 0.0–5.0)
HCT: 42.8 % (ref 36.0–46.0)
Hemoglobin: 13.8 g/dL (ref 12.0–15.0)
Lymphocytes Relative: 32 % (ref 12.0–46.0)
Lymphs Abs: 2.3 10*3/uL (ref 0.7–4.0)
MCHC: 32.3 g/dL (ref 30.0–36.0)
MCV: 85 fl (ref 78.0–100.0)
Monocytes Absolute: 0.5 10*3/uL (ref 0.1–1.0)
Monocytes Relative: 7.5 % (ref 3.0–12.0)
Neutro Abs: 4.1 10*3/uL (ref 1.4–7.7)
Neutrophils Relative %: 57.1 % (ref 43.0–77.0)
Platelets: 222 10*3/uL (ref 150.0–400.0)
RBC: 5.04 Mil/uL (ref 3.87–5.11)
RDW: 13.3 % (ref 11.5–15.5)
WBC: 7.1 10*3/uL (ref 4.0–10.5)

## 2022-11-18 LAB — LIPID PANEL
Cholesterol: 261 mg/dL — ABNORMAL HIGH (ref 0–200)
HDL: 54.4 mg/dL (ref >39.00)
LDL Cholesterol: 168 mg/dL — ABNORMAL HIGH (ref 0–99)
NonHDL: 206.88
Total CHOL/HDL Ratio: 5
Triglycerides: 194 mg/dL — ABNORMAL HIGH (ref 0.0–149.0)
VLDL: 38.8 mg/dL (ref 0.0–40.0)

## 2022-11-18 LAB — TSH: TSH: 0.79 u[IU]/mL (ref 0.35–5.50)

## 2022-11-18 NOTE — Patient Instructions (Addendum)
Welcome to [facility], It was a pleasure meeting you today!    As discussed, ask your GYN about Estring vaginal ring for menopausal symptoms - mainly vaginal dryness & pain w/intercourse. If not covered by insuranace (can look online for coupons!) you can also try generic Vagifem which are pills that come with applicators and you insert 3-4 nights per week (after 2w nightly initiation). Also you can ask about a pill to help labido is Addyi (Fibanserin) - but this may only be for pre-menopausal women - need to check if indication has changed.  Can also check out Dr. Wyvonne Lenz (spelling?) who is an online OB/GYN physician that sells natural products to help labido.  Maca powder is one supplement that has been shown to help.   PLEASE NOTE: If you had any LAB tests please let us know if you have not heard back within a few days. You may see your results on MyChart before we have a chance to review them but we will give you a call once they are reviewed by [place]. If we ordered any REFERRALS today, please let us know if you have not heard from their office within the next week.  Let us know through MyChart if you are needing REFILLS, or have your pharmacy send [place] the request. You can also use MyChart to communicate with me or any office staff.  Please try these tips to maintain a healthy lifestyle: It is important that you exercise regularly at least 30 minutes 5 times a week. Think about what you will eat, plan ahead. Choose whole foods, & think  "clean, green, fresh or frozen" over canned, processed or packaged foods which are more sugary, salty, and fatty. 70 to 75% of food eaten should be fresh vegetables and protein. 2-3  meals daily with healthy snacks between meals, but must be whole fruit, protein or vegetables. Aim to eat over a 10 hour period when you are active, for example, 7am to 5pm, and then STOP after your last meal of the day, drinking only water.   Shorter eating windows, 6-8 hours, are showing benefits in heart disease and blood sugar regulation. Drink water every day! Shoot for 64 ounces daily = 8 cups, no other drink is as healthy! Fruit juice is best enjoyed in a healthy way, by EATING the fruit.

## 2022-11-18 NOTE — Progress Notes (Deleted)
New Patient Office Visit  Subjective:  Patient ID: Michaela Gutierrez, female    DOB: 1965-03-19  Age: 57 y.o. MRN: [id number]  CC: No chief complaint on file.   HPI Michaela Gutierrez presents for establishing care today.  Assessment & Plan:  There are no diagnoses linked to this encounter.  Subjective:    Outpatient Medications Prior to Visit  Medication Sig Dispense Refill   amoxicillin (AMOXIL) 500 MG capsule Take 1 capsule by mouth three times a day for 3 days. 9 capsule 0   Ascorbic Acid (VITAMIN C) 1000 MG tablet Take 1,000 mg by mouth daily. (Patient not taking: Reported on 10/24/2020)     buPROPion (WELLBUTRIN XL) 150 MG 24 hr tablet Take 1 tablet (150 mg total) by mouth in the morning. 90 tablet 1   buPROPion (WELLBUTRIN XL) 150 MG 24 hr tablet Take 1 tablet (150 mg total) by mouth in the morning. 90 tablet 0   buPROPion (WELLBUTRIN XL) 150 MG 24 hr tablet Take one tablet by mouth in the morning 90 tablet 1   buPROPion (WELLBUTRIN XL) 300 MG 24 hr tablet Take 300 mg by mouth daily.     buPROPion (WELLBUTRIN XL) 300 MG 24 hr tablet TAKE 1 TABLET BY MOUTH EVERY MORNING 60 tablet 0   celecoxib (CELEBREX) 200 MG capsule Take 1 capsule (200 mg total) by mouth daily. 90 capsule 2   chlorhexidine (PERIDEX) 0.12 % solution Swish for 30 sec then spit, use twice daily 437 mL 0   cholecalciferol (VITAMIN D3) 25 MCG (1000 UNIT) tablet Take 1,000 Units by mouth daily. (Patient not taking: Reported on 10/24/2020)     clonazePAM (KLONOPIN) 0.5 MG tablet Take 0.5 mg by mouth 2 (two) times daily as needed.     clonazePAM (KLONOPIN) 0.5 MG tablet Take 1 tablet (0.5 mg total) by mouth 2 (two) times daily, and may take 1 extra tablet if having panic attack. 90 tablet 0   clonazePAM (KLONOPIN) 0.5 MG tablet Take 1 tablet (0.5 mg total) by mouth 2 (two) times daily. 60 tablet 5   clonazePAM (KLONOPIN) 0.5 MG tablet Take 1 tablet (0.5 mg total) by mouth 2 (two) times daily. 60 tablet 5   clonazePAM (KLONOPIN)  0.5 MG tablet Take 1 tablet (0.5 mg total) by mouth 2 (two) times daily. 60 tablet 0   Cyanocobalamin (VITAMIN B 12 PO) Take by mouth. (Patient not taking: Reported on 10/24/2020)     dexamethasone (DECADRON) 4 MG tablet Take 1 tablet (4 mg total) by mouth 3 (three) times daily as needed for swelling 9 tablet 0   estradiol (CLIMARA - DOSED IN MG/24 HR) 0.05 mg/24hr patch Place 1 patch (0.05 mg total) onto the skin once a week. 4 patch 1   estradiol (ESTRACE) 0.1 MG/GM vaginal cream Apply a pea-sized amount intervaginally nightly for 14 nights, then every other night for 14 nights, then 1-2 times per week 42.5 g 0   estradiol (VIVELLE-DOT) 0.1 MG/24HR patch Place 1 patch (0.1 mg total) onto the skin 2 (two) times a week. 24 patch 1   ibuprofen (ADVIL) 400 MG tablet Take 1 tablet (400 mg total) by mouth every 4 (four) hours as needed for pain 30 tablet 0   Semaglutide, 1 MG/DOSE, (OZEMPIC, 1 MG/DOSE,) 4 MG/3ML SOPN Inject 1 mg into the skin once a week. 3 mL 1   Semaglutide-Weight Management (WEGOVY) 1 MG/0.5ML SOAJ Inject 1 mg into the skin once a week. 2 mL 0  Semaglutide-Weight Management (WEGOVY) 1.7 MG/0.75ML SOAJ Inject 1.7 mg into the skin once a week. 3 mL 1   Semaglutide-Weight Management (WEGOVY) 2.4 MG/0.75ML SOAJ Inject 2.4 mg into the skin once a week. 3 mL 5   Semaglutide-Weight Management (WEGOVY) 2.4 MG/0.75ML SOAJ Inject 2.4 mg into the skin once a week. 3 mL 6   sertraline (ZOLOFT) 100 MG tablet Take 100 mg by mouth daily.     sertraline (ZOLOFT) 100 MG tablet TAKE 1 & 1/2 TABLETS BY MOUTH IN THE MORNING 180 tablet 0   sertraline (ZOLOFT) 100 MG tablet Take 1.5 tablets (150 mg total) by mouth in the morning. 135 tablet 1   sertraline (ZOLOFT) 100 MG tablet Take 1.5 tablets (150 mg total) by mouth in the morning. 135 tablet 1   tirzepatide (ZEPBOUND) 10 MG/0.5ML Pen Inject 10 mg into the skin once a week. 6 mL 0   tirzepatide (ZEPBOUND) 5 MG/0.5ML Pen Inject 5 mg into the skin once a  week. 2 mL 2   tirzepatide (ZEPBOUND) 5 MG/0.5ML Pen Inject 5 mg into the skin once a week. 6.5 mL 0   tirzepatide (ZEPBOUND) 7.5 MG/0.5ML Pen Inject 7.5 mg into the skin once a week. 2 mL 1   zolpidem (AMBIEN) 10 MG tablet Take 10 mg by mouth at bedtime as needed for sleep.     No facility-administered medications prior to visit.   Past Medical History:  Diagnosis Date   Anemia    past hx    Anxiety    Arthritis    OA hands    History of hysterectomy 01/29/2020   Past Surgical History:  Procedure Laterality Date   ABDOMINAL HYSTERECTOMY     Broken bone repair     CESAREAN SECTION     x2    Objective:   Today's Vitals: There were no vitals taken for this visit.  Physical Exam Vitals and nursing note reviewed.  Constitutional:      Appearance: Normal appearance.  Cardiovascular:     Rate and Rhythm: Normal rate and regular rhythm.  Pulmonary:     Effort: Pulmonary effort is normal.     Breath sounds: Normal breath sounds.  Musculoskeletal:        General: Normal range of motion.  Skin:    General: Skin is warm and dry.  Neurological:     Mental Status: She is alert.  Psychiatric:        Mood and Affect: Mood normal.        Behavior: Behavior normal.     No orders of the defined types were placed in this encounter.   Dulce Sellar, NP

## 2022-11-18 NOTE — Progress Notes (Unsigned)
Phone [phone]  Subjective:   Patient is a 57 y.o. female presenting for annual physical.    Chief Complaint  Patient presents with   New Patient (Initial Visit)   Annual Exam    Not fasting w/ labs    Hyperlipidemia: Patient is currently maintained on the following medication for hyperlipidemia: None, lifestyle modifications. Patient reports good compliance with low fat/low cholesterol diet.  Last lipid panel as follows: Lab Results  Component Value Date   CHOL 261 (H) 11/18/2022   HDL 54.40 11/18/2022   LDLCALC 168 (H) 11/18/2022   LDLDIRECT 160.4 09/03/2009   TRIG 194.0 (H) 11/18/2022   CHOLHDL 5 11/18/2022   Postmenopausal sx:  Hx of total hysterectomy in 1995, minus ovaries, pt reports seeing GYN in past and given HRT patch, but did not notice any improvement in her sx, denies hx of breast or ovarian ca or in family. Also tried Estrace vaginal cream but found it to be too messy. Reports vaginal dryness & dyspareunia, and no labido.  See problem oriented charting- ROS- full  review of systems was completed and negative except for: Hyperlipidemia noted in HPI above.  The following were reviewed and entered/updated in epic: Past Medical History:  Diagnosis Date   Anemia    past hx    Anxiety    Arthritis    OA hands    History of hysterectomy 01/29/2020   Patient Active Problem List   Diagnosis Date Noted   History of hysterectomy 01/29/2020   Plantar fasciitis 05/03/2019   Osteoarthritis 10/27/2012   Heberden's nodes 10/27/2012   MDD (major depressive disorder) 05/26/2007   Generalized anxiety disorder 07/27/2006   Past Surgical History:  Procedure Laterality Date   ABDOMINAL HYSTERECTOMY     Broken bone repair     CESAREAN SECTION     x2    Family History  Problem Relation Age of Onset   Diabetes Mother    Depression Mother    Anxiety disorder Mother    Arthritis Mother    Hypertension Mother    Prostate cancer Paternal Grandfather    Colon  polyps Neg Hx    Colon cancer Neg Hx    Esophageal cancer Neg Hx    Rectal cancer Neg Hx    Stomach cancer Neg Hx     Medications- reviewed and updated Current Outpatient Medications  Medication Sig Dispense Refill   buPROPion (WELLBUTRIN XL) 150 MG 24 hr tablet Take one tablet by mouth in the morning 90 tablet 1   celecoxib (CELEBREX) 200 MG capsule Take 1 capsule (200 mg total) by mouth daily. 90 capsule 2   clonazePAM (KLONOPIN) 0.5 MG tablet Take 1 tablet (0.5 mg total) by mouth 2 (two) times daily. 60 tablet 5   Semaglutide, 1 MG/DOSE, (OZEMPIC, 1 MG/DOSE,) 4 MG/3ML SOPN Inject 1 mg into the skin once a week. 3 mL 1   sertraline (ZOLOFT) 100 MG tablet Take 1.5 tablets (150 mg total) by mouth in the morning. 135 tablet 1   No current facility-administered medications for this visit.    Allergies-reviewed and updated No Known Allergies  Social History   Social History Narrative   Not on file    Objective:  BP 120/67 (BP Location: Left Arm, Patient Position: Sitting, Cuff Size: Large)   Pulse 80   Temp 98 F (36.7 C) (Temporal)   Ht 5\' 5"  (1.651 m)   Wt 181 lb 8 oz (82.3 kg)   SpO2 97%   BMI 30.20  kg/m  Physical Exam Vitals and nursing note reviewed.  Constitutional:      Appearance: Normal appearance.  HENT:     Head: Normocephalic.     Right Ear: Tympanic membrane normal.     Left Ear: Tympanic membrane normal.     Nose: Nose normal.     Mouth/Throat:     Mouth: Mucous membranes are moist.  Eyes:     Pupils: Pupils are equal, round, and reactive to light.  Cardiovascular:     Rate and Rhythm: Normal rate and regular rhythm.  Pulmonary:     Effort: Pulmonary effort is normal.     Breath sounds: Normal breath sounds.  Musculoskeletal:        General: Normal range of motion.     Cervical back: Normal range of motion.  Lymphadenopathy:     Cervical: No cervical adenopathy.  Skin:    General: Skin is warm and dry.  Neurological:     Mental Status: She is  alert.  Psychiatric:        Mood and Affect: Mood normal.        Behavior: Behavior normal.     Assessment and Plan   Health Maintenance counseling: 1. Anticipatory guidance: Patient counseled regarding regular dental exams q6 months, eye exams,  avoiding smoking and second hand smoke, limiting alcohol to 1 beverage per day, no illicit drugs.   2. Risk factor reduction:  Advised patient of need for regular exercise and diet rich with fruits and vegetables to reduce risk of heart attack and stroke. Exercise- 3d/week.  Wt Readings from Last 3 Encounters:  11/18/22 181 lb 8 oz (82.3 kg)  10/24/20 176 lb (79.8 kg)  04/11/20 184 lb (83.5 kg)   3. Immunizations/screenings/ancillary studies Immunization History  Administered Date(s) Administered   Influenza,inj,quad, With Preservative 11/24/2018   Influenza-Unspecified 12/18/2019   PFIZER(Purple Top)SARS-COV-2 Vaccination 02/21/2019, 03/13/2019   Pneumococcal Polysaccharide-23 12/08/2011   Tdap 12/08/2011   Unspecified SARS-COV-2 Vaccination 03/27/2019   Zoster Recombinant(Shingrix) 04/19/2017   Health Maintenance Due  Topic Date Due   DTaP/Tdap/Td (2 - Td or Tdap) 12/07/2021    4. Cervical cancer screening-  hx of hysterectomy 1995 5. Breast cancer screening-  mammogram scheduled next week. 6. Colon cancer screening - due 2027 7. Skin cancer screening- advised regular sunscreen use. Denies worrisome, changing, or new skin lesions.  8. Birth control/STD check- N/A 9. Osteoporosis screening- N/A 10. Alcohol screening: socially 11. Smoking associated screening (lung cancer screening, AAA screen 65-75, UA)- non- smoker  Annual physical exam -     CBC with Differential/Platelet -     Comprehensive metabolic panel -     Lipid panel -     TSH  Postmenopausal- advised pt on hormone options including Estring, Vagifem, Estradiol, and non-hormonal Addyi for labido. Pt will discuss with her GYN. Also can look for OTC Maca powder for  labido.  Screening for HIV (human immunodeficiency virus) -     HIV Antibody (routine testing w rflx)  Need for hepatitis C screening test -     Hepatitis C antibody   Recommended follow up:  No follow-ups on file. No future appointments.  Lab/Order associations: not- fasting   Dulce Sellar, NP

## 2022-11-19 LAB — HEPATITIS C ANTIBODY: Hepatitis C Ab: NONREACTIVE

## 2022-11-19 LAB — HIV ANTIBODY (ROUTINE TESTING W REFLEX): HIV 1&2 Ab, 4th Generation: NONREACTIVE

## 2022-12-03 ENCOUNTER — Other Ambulatory Visit (HOSPITAL_BASED_OUTPATIENT_CLINIC_OR_DEPARTMENT_OTHER): Payer: Self-pay

## 2022-12-15 ENCOUNTER — Other Ambulatory Visit: Payer: Self-pay

## 2022-12-15 DIAGNOSIS — Z1231 Encounter for screening mammogram for malignant neoplasm of breast: Secondary | ICD-10-CM | POA: Diagnosis not present

## 2022-12-15 DIAGNOSIS — Z6832 Body mass index (BMI) 32.0-32.9, adult: Secondary | ICD-10-CM | POA: Diagnosis not present

## 2022-12-15 DIAGNOSIS — E669 Obesity, unspecified: Secondary | ICD-10-CM | POA: Diagnosis not present

## 2022-12-15 LAB — HM MAMMOGRAPHY

## 2022-12-16 ENCOUNTER — Encounter: Payer: Self-pay | Admitting: Family

## 2022-12-16 ENCOUNTER — Other Ambulatory Visit (HOSPITAL_COMMUNITY): Payer: Self-pay

## 2022-12-16 ENCOUNTER — Other Ambulatory Visit (HOSPITAL_BASED_OUTPATIENT_CLINIC_OR_DEPARTMENT_OTHER): Payer: Self-pay

## 2022-12-16 MED ORDER — WEGOVY 1 MG/0.5ML ~~LOC~~ SOAJ
1.0000 mg | SUBCUTANEOUS | 0 refills | Status: DC
  Filled 2022-12-16 – 2023-05-07 (×5): qty 2, 28d supply, fill #0

## 2022-12-16 NOTE — Telephone Encounter (Signed)
needs office visit please

## 2022-12-21 ENCOUNTER — Ambulatory Visit (INDEPENDENT_AMBULATORY_CARE_PROVIDER_SITE_OTHER): Payer: 59 | Admitting: Family

## 2022-12-21 VITALS — BP 116/81 | HR 89 | Temp 98.2°F | Ht 65.0 in | Wt 185.4 lb

## 2022-12-21 DIAGNOSIS — M5412 Radiculopathy, cervical region: Secondary | ICD-10-CM | POA: Diagnosis not present
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
## 2022-12-23 ENCOUNTER — Other Ambulatory Visit (HOSPITAL_BASED_OUTPATIENT_CLINIC_OR_DEPARTMENT_OTHER): Payer: Self-pay

## 2022-12-23 ENCOUNTER — Encounter: Payer: Self-pay | Admitting: Family Medicine

## 2022-12-23 ENCOUNTER — Ambulatory Visit: Payer: 59 | Admitting: Family Medicine

## 2022-12-23 ENCOUNTER — Other Ambulatory Visit: Payer: Self-pay

## 2022-12-23 VITALS — BP 122/84 | Ht 65.0 in | Wt 184.0 lb

## 2022-12-23 DIAGNOSIS — M5412 Radiculopathy, cervical region: Secondary | ICD-10-CM | POA: Diagnosis not present

## 2022-12-23 DIAGNOSIS — M79602 Pain in left arm: Secondary | ICD-10-CM

## 2022-12-23 DIAGNOSIS — M67912 Unspecified disorder of synovium and tendon, left shoulder: Secondary | ICD-10-CM | POA: Diagnosis not present

## 2022-12-23 MED ORDER — PREDNISONE 10 MG PO TABS
ORAL_TABLET | ORAL | 0 refills | Status: DC
  Filled 2022-12-23: qty 21, 6d supply, fill #0

## 2022-12-23 NOTE — Progress Notes (Unsigned)
Michaela Gutierrez - 57 y.o. female MRN [id number]  Date of birth: [birth year]  PCP: Dulce Sellar, NP  Subjective:  No chief complaint on file. Left sided radicular pain  HPI: Past Medical, Surgical, Social, and Family History Reviewed & Updated per EMR.   Patient is a 57 y.o. female here for several months of left-sided pain that runs from her neck down her left arm and into the first and second digit with intermittent numbness and cramping.  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  Putting her arm and extreme of internal rotation alleviates some of her symptoms.  Left sidebending of the neck and traction of the left arm recreates the pain. No dizziness, confusion, neck trauma or previous imaging but some chronic neck pain. She does have OA of the bilateral hands which she takes Celebrex for.    Past Surgical History:  Procedure Laterality Date   ABDOMINAL HYSTERECTOMY     Broken bone repair     CESAREAN SECTION     x2    No Known Allergies      Objective:  Physical Exam: VS: BP:122/84  HR: bpm  TEMP: ( )  RESP:   HT:5\' 5"  (165.1 cm)   WT:184 lb (83.5 kg)  BMI:30.62  Gen: NAD, speaks clearly, comfortable in exam room Respiratory: normal work of breathing on room air Skin: No rashes, abrasions, or ecchymosis MSK:  Inspection of the neck shows no abnormalities.  Range of motion is full in all directions. Spurling's positive on the left for radicular symptoms in the first and second digits as well as along the C5-C6 distribution dermatome Left shoulder range of motion is full Mild tenderness to palpation over the posterior lateral aspect over the infraspinatus Frank 5/5 in all directions Load-and-shift negative Hawkins, Neer's, O'Briens, speeds, Yergason all negative Crossover, empty can positive No sensory deficits  Limited ultrasound of left shoulder Biceps Tendon SAX and LAX: visualized in bicipital groove w/ out hypoechoic  changes, pectoralis and subscapularis insertions in tact, IR/ER does not demonstrate popping of the tendon out of the groove Subscapularis tendon - viewed in SAX and LAX inserting into the inferior lesser tubercle of humerus. echogenics: no hypo/hyperechoic changes present AC joint -no narrowing w/ osteophyte formation, mild giser sign present Supraspinatus tendon - viewed in SAX and LAX, tendon in tact, insertion at superior facet of greater tubercle of the humerus w/ echogenics: hypoechoic changes within the tendon confirmed in both views just proximal to the insertion point, dynamic view w/ out impingement at the acromion  Infraspinatus and Teres minor tendons - viewed in LAX and SAX at the middle facet of the greater tubercle of humerus w/ some hypoechogenic changes within the distal tendon.  Tendon fibers intact.   Summary: Partial tear of the distal infraspinatus and tendinosis of the supraspinatus  Ultrasound and interpretation by Dr. Webb Silversmith and Dr. Pearletha Forge  Assessment & Plan:   Tendinopathy of left rotator cuff - The patient's history and exam consistent with some mild rotator cuff involvement although I do not believe this explains all of her symptoms. - Ultrasound findings consistent with small partial tears of the left supraspinatus and infraspinatus at the insertion points. - I discussed treatment options with the patient including injection therapy, formal physical therapy, home exercises, and after joint decision-making she would like to try home exercises first for conservative management.  She already takes Celebrex for her hand osteoarthritis. - She can use ice or  Voltaren gel topically. - We will follow-up in 3 weeks.  If no improvement we can consider steroid injection at that time.  Cervical radiculopathy - The patient's history and exam findings are consistent with left-sided cervical radiculopathy in the C5-C6 distribution.  This may be secondary to facet arthropathy or  compression from disc pathology. - After discussing the options, we have decided to try a steroid Dosepak for 6 days and range of motion exercises to be done at home. - We will follow-up in 3 weeks.  If no improvement we can proceed with an MRI and/or formal physical therapy. - The patient voiced understanding and agreement with this plan.    Rica Mote MD [facility] Fellow   Addendum:  Patient seen in the office by fellow.  His history, exam, plan of care were precepted with me.  Norton Blizzard MD Marrianne Mood

## 2022-12-23 NOTE — Assessment & Plan Note (Signed)
-   The patient's history and exam consistent with some mild rotator cuff involvement although I do not believe this explains all of her symptoms. - Ultrasound findings consistent with small partial tears of the left supraspinatus and infraspinatus at the insertion points. - I discussed treatment options with the patient including injection therapy, formal physical therapy, home exercises, and after joint decision-making she would like to try home exercises first for conservative management.  She already takes Celebrex for her hand osteoarthritis. - She can use ice or Voltaren gel topically. - We will follow-up in 3 weeks.  If no improvement we can consider steroid injection at that time.

## 2022-12-23 NOTE — Assessment & Plan Note (Signed)
-   The patient's history and exam findings are consistent with left-sided cervical radiculopathy in the C5-C6 distribution.  This may be secondary to facet arthropathy or compression from disc pathology. - After discussing the options, we have decided to try a steroid Dosepak for 6 days and range of motion exercises to be done at home. - We will follow-up in 3 weeks.  If no improvement we can proceed with an MRI and/or formal physical therapy. - The patient voiced understanding and agreement with this plan.

## 2023-01-05 ENCOUNTER — Other Ambulatory Visit (HOSPITAL_BASED_OUTPATIENT_CLINIC_OR_DEPARTMENT_OTHER): Payer: Self-pay

## 2023-01-06 ENCOUNTER — Telehealth: Payer: Self-pay

## 2023-01-06 NOTE — Telephone Encounter (Signed)
Patient called office to schedule 3 week follow up, patient would like to know if it is ok to schedule MRI, please advise, thanks.

## 2023-01-12 ENCOUNTER — Other Ambulatory Visit (HOSPITAL_BASED_OUTPATIENT_CLINIC_OR_DEPARTMENT_OTHER): Payer: Self-pay

## 2023-01-12 DIAGNOSIS — F410 Panic disorder [episodic paroxysmal anxiety] without agoraphobia: Secondary | ICD-10-CM | POA: Diagnosis not present

## 2023-01-12 MED ORDER — TRINTELLIX 10 MG PO TABS
10.0000 mg | ORAL_TABLET | Freq: Every day | ORAL | 5 refills | Status: AC
  Filled 2023-01-12: qty 30, 30d supply, fill #0
  Filled 2023-02-20: qty 30, 30d supply, fill #1
  Filled 2023-03-15 – 2023-03-19 (×2): qty 30, 30d supply, fill #2

## 2023-01-12 MED ORDER — CLONAZEPAM 0.5 MG PO TABS
0.5000 mg | ORAL_TABLET | Freq: Every day | ORAL | 5 refills | Status: DC
  Filled 2023-01-12: qty 30, 30d supply, fill #0
  Filled 2023-02-12: qty 30, 30d supply, fill #1
  Filled 2023-03-15 – 2023-03-19 (×2): qty 30, 30d supply, fill #2
  Filled 2023-03-26 – 2023-04-16 (×4): qty 30, 30d supply, fill #3
  Filled ????-??-??: fill #4

## 2023-01-12 MED ORDER — BUPROPION HCL ER (XL) 150 MG PO TB24
150.0000 mg | ORAL_TABLET | Freq: Every morning | ORAL | 1 refills | Status: DC
  Filled 2023-01-12 – 2023-03-19 (×3): qty 90, 90d supply, fill #0
  Filled 2023-11-09 – 2024-01-05 (×2): qty 90, 90d supply, fill #1

## 2023-01-15 ENCOUNTER — Other Ambulatory Visit (HOSPITAL_BASED_OUTPATIENT_CLINIC_OR_DEPARTMENT_OTHER): Payer: Self-pay

## 2023-01-15 ENCOUNTER — Ambulatory Visit: Payer: 59 | Admitting: Family Medicine

## 2023-01-15 VITALS — BP 122/80 | Ht 64.0 in | Wt 184.0 lb

## 2023-01-15 DIAGNOSIS — M5412 Radiculopathy, cervical region: Secondary | ICD-10-CM | POA: Diagnosis not present

## 2023-01-15 DIAGNOSIS — M67912 Unspecified disorder of synovium and tendon, left shoulder: Secondary | ICD-10-CM

## 2023-01-15 MED ORDER — GABAPENTIN 100 MG PO CAPS
ORAL_CAPSULE | ORAL | 1 refills | Status: DC
  Filled 2023-01-15: qty 90, 33d supply, fill #0
  Filled 2023-02-12: qty 90, 30d supply, fill #1

## 2023-01-15 NOTE — Patient Instructions (Addendum)
Great to see you! I will call you with the MRI results. You call us if something new occurs or if you have questions.

## 2023-01-16 NOTE — Progress Notes (Unsigned)
  Michaela Gutierrez - 57 y.o. female MRN [id number]  Date of birth: 1965-04-08    SUBJECTIVE:      Chief Complaint:/ HPI:   F/u of left arm paresthesias, posterior shoulder pain. Has been doing the PT/HEP program at home and if anything, it seems to make her symptoms worse. Symptoms in left arm start at neck area , runs down arm and into thumb and index finger. There is an area in the 1-2 web space that is constantly numb. This level of symptoms has been over last 4-8 weeks, increasing in severity. Certain positions make it worse. Not much really makes it better. Additionally she was having some posterior shulder pain over the shoulder blade area and that seems to be improving by about 30%. No known traumatic inciting event.  PERTINENT  PMH / PSH: I have reviewed the patient's medications, allergies, past medical and surgical history, smoking status.  Pertinent findings that relate to today's visit / issues include: Bilateral hand osteoarthritis and t akes Celebrex for that.  OBJECTIVE: BP 122/80   Ht 5\' 4"  (1.626 m)   Wt 184 lb (83.5 kg)   BMI 31.58 kg/m   Physical Exam:  Vital signs are reviewed. GEN WD WN NAD EXT/NEURO. LEFT UPPER EXTREMITY:Numb web space between thumb and index finger. Area she points to for location parasthesias C4-5-6 demratones. Positive Spurlings on left. Weakness in   ASSESSMENT & PLAN:  See problem based charting & AVS for pt instructions. No problem-specific Assessment & Plan notes found for this encounter.

## 2023-01-17 ENCOUNTER — Encounter: Payer: Self-pay | Admitting: Family Medicine

## 2023-01-17 NOTE — Assessment & Plan Note (Signed)
Return of symptoms after steroid dose pack indicates to me she has some inflammatory change around a nerve root(s), potentially from HNP or increased size of osteophytes crowding neural foramina. Only way to further evaluate  this would be cervical MRI (no contrast needed for this initial advanced imaging). Will try gabapentin taper up (slowly by her request) starting at 100 mg qhs to see if we can improve symptoms. I would discontinue PT/HEP while we await further imagimg as she seems to have increased symptoms in arm from this.

## 2023-01-17 NOTE — Assessment & Plan Note (Signed)
This seems to have improved since last ov. Likely  a combo of HEP and (most likely) oral steroid dose pack made the improvement. This seems to be much less of an issue for now so with current level of improvement, no further treatment as we are holding on HEP in favor of decreasing sx of cervical radiculopathy.

## 2023-02-03 ENCOUNTER — Encounter: Payer: Self-pay | Admitting: Family Medicine

## 2023-02-10 ENCOUNTER — Telehealth (INDEPENDENT_AMBULATORY_CARE_PROVIDER_SITE_OTHER): Payer: 59 | Admitting: Family Medicine

## 2023-02-10 ENCOUNTER — Encounter: Payer: Self-pay | Admitting: Family Medicine

## 2023-02-10 DIAGNOSIS — M5412 Radiculopathy, cervical region: Secondary | ICD-10-CM

## 2023-02-11 ENCOUNTER — Encounter: Payer: Self-pay | Admitting: Family Medicine

## 2023-02-11 NOTE — Telephone Encounter (Signed)
Reviewed MRI report Will scan copy to media tab Denny Levy

## 2023-02-11 NOTE — Telephone Encounter (Signed)
Reviewed lumbar mri report and will scan copy to media tab Denny Levy

## 2023-02-12 ENCOUNTER — Other Ambulatory Visit: Payer: Self-pay

## 2023-02-12 ENCOUNTER — Other Ambulatory Visit (HOSPITAL_BASED_OUTPATIENT_CLINIC_OR_DEPARTMENT_OTHER): Payer: Self-pay

## 2023-02-12 MED ORDER — CELECOXIB 200 MG PO CAPS
200.0000 mg | ORAL_CAPSULE | Freq: Every day | ORAL | 2 refills | Status: DC
  Filled 2023-02-12 – 2023-02-20 (×2): qty 90, 90d supply, fill #0
  Filled 2023-03-15 – 2023-08-06 (×2): qty 90, 90d supply, fill #1
  Filled 2023-11-11: qty 90, 90d supply, fill #2

## 2023-02-20 ENCOUNTER — Other Ambulatory Visit (HOSPITAL_BASED_OUTPATIENT_CLINIC_OR_DEPARTMENT_OTHER): Payer: Self-pay

## 2023-02-22 ENCOUNTER — Other Ambulatory Visit (HOSPITAL_BASED_OUTPATIENT_CLINIC_OR_DEPARTMENT_OTHER): Payer: Self-pay

## 2023-02-22 ENCOUNTER — Other Ambulatory Visit: Payer: Self-pay

## 2023-03-02 ENCOUNTER — Other Ambulatory Visit (HOSPITAL_BASED_OUTPATIENT_CLINIC_OR_DEPARTMENT_OTHER): Payer: Self-pay

## 2023-03-02 DIAGNOSIS — H5203 Hypermetropia, bilateral: Secondary | ICD-10-CM | POA: Diagnosis not present

## 2023-03-02 MED ORDER — ACETAMINOPHEN 500 MG PO TABS
1000.0000 mg | ORAL_TABLET | Freq: Four times a day (QID) | ORAL | 0 refills | Status: AC
  Filled 2023-03-02: qty 56, 7d supply, fill #0

## 2023-03-02 MED ORDER — AMOXICILLIN 500 MG PO CAPS
500.0000 mg | ORAL_CAPSULE | Freq: Three times a day (TID) | ORAL | 0 refills | Status: AC
  Filled 2023-03-02: qty 21, 7d supply, fill #0

## 2023-03-02 MED ORDER — DEXAMETHASONE 4 MG PO TABS
4.0000 mg | ORAL_TABLET | Freq: Three times a day (TID) | ORAL | 0 refills | Status: AC | PRN
  Filled 2023-03-02: qty 9, 3d supply, fill #0

## 2023-03-05 DIAGNOSIS — Z6833 Body mass index (BMI) 33.0-33.9, adult: Secondary | ICD-10-CM | POA: Diagnosis not present

## 2023-03-05 DIAGNOSIS — M4722 Other spondylosis with radiculopathy, cervical region: Secondary | ICD-10-CM | POA: Diagnosis not present

## 2023-03-05 DIAGNOSIS — M542 Cervicalgia: Secondary | ICD-10-CM | POA: Diagnosis not present

## 2023-03-08 ENCOUNTER — Other Ambulatory Visit (HOSPITAL_BASED_OUTPATIENT_CLINIC_OR_DEPARTMENT_OTHER): Payer: Self-pay

## 2023-03-15 ENCOUNTER — Other Ambulatory Visit: Payer: Self-pay | Admitting: Family Medicine

## 2023-03-15 ENCOUNTER — Other Ambulatory Visit (HOSPITAL_BASED_OUTPATIENT_CLINIC_OR_DEPARTMENT_OTHER): Payer: Self-pay

## 2023-03-15 NOTE — Telephone Encounter (Signed)
RX request needs to go to provider who prescribed, refusing refill.

## 2023-03-17 ENCOUNTER — Other Ambulatory Visit (HOSPITAL_BASED_OUTPATIENT_CLINIC_OR_DEPARTMENT_OTHER): Payer: Self-pay

## 2023-03-18 ENCOUNTER — Encounter (HOSPITAL_BASED_OUTPATIENT_CLINIC_OR_DEPARTMENT_OTHER): Payer: Self-pay | Admitting: Pharmacist

## 2023-03-18 ENCOUNTER — Other Ambulatory Visit (HOSPITAL_BASED_OUTPATIENT_CLINIC_OR_DEPARTMENT_OTHER): Payer: Self-pay

## 2023-03-18 ENCOUNTER — Other Ambulatory Visit: Payer: Self-pay | Admitting: Family Medicine

## 2023-03-19 ENCOUNTER — Encounter (HOSPITAL_BASED_OUTPATIENT_CLINIC_OR_DEPARTMENT_OTHER): Payer: Self-pay

## 2023-03-19 ENCOUNTER — Other Ambulatory Visit (HOSPITAL_BASED_OUTPATIENT_CLINIC_OR_DEPARTMENT_OTHER): Payer: Self-pay

## 2023-03-19 ENCOUNTER — Other Ambulatory Visit (HOSPITAL_COMMUNITY): Payer: Self-pay

## 2023-03-19 MED ORDER — GABAPENTIN 300 MG PO CAPS
300.0000 mg | ORAL_CAPSULE | Freq: Every day | ORAL | 3 refills | Status: AC
Start: 2023-03-19 — End: ?
  Filled 2023-03-19 – 2023-03-26 (×2): qty 90, 90d supply, fill #0
  Filled 2023-06-19: qty 90, 90d supply, fill #1
  Filled 2023-09-19: qty 90, 90d supply, fill #2
  Filled 2023-12-22: qty 90, 90d supply, fill #3

## 2023-03-23 DIAGNOSIS — M62830 Muscle spasm of back: Secondary | ICD-10-CM | POA: Diagnosis not present

## 2023-03-23 DIAGNOSIS — M6281 Muscle weakness (generalized): Secondary | ICD-10-CM | POA: Diagnosis not present

## 2023-03-23 DIAGNOSIS — M542 Cervicalgia: Secondary | ICD-10-CM | POA: Diagnosis not present

## 2023-03-23 DIAGNOSIS — M4722 Other spondylosis with radiculopathy, cervical region: Secondary | ICD-10-CM | POA: Diagnosis not present

## 2023-03-24 ENCOUNTER — Other Ambulatory Visit (HOSPITAL_BASED_OUTPATIENT_CLINIC_OR_DEPARTMENT_OTHER): Payer: Self-pay

## 2023-03-24 MED ORDER — CITALOPRAM HYDROBROMIDE 20 MG PO TABS
ORAL_TABLET | ORAL | 0 refills | Status: AC
  Filled 2023-03-24: qty 30, 30d supply, fill #0

## 2023-03-26 ENCOUNTER — Other Ambulatory Visit (HOSPITAL_COMMUNITY): Payer: Self-pay

## 2023-03-26 ENCOUNTER — Other Ambulatory Visit (HOSPITAL_BASED_OUTPATIENT_CLINIC_OR_DEPARTMENT_OTHER): Payer: Self-pay

## 2023-04-09 ENCOUNTER — Other Ambulatory Visit (HOSPITAL_BASED_OUTPATIENT_CLINIC_OR_DEPARTMENT_OTHER): Payer: Self-pay

## 2023-04-10 ENCOUNTER — Other Ambulatory Visit (HOSPITAL_BASED_OUTPATIENT_CLINIC_OR_DEPARTMENT_OTHER): Payer: Self-pay

## 2023-04-15 ENCOUNTER — Other Ambulatory Visit: Payer: Self-pay

## 2023-04-15 ENCOUNTER — Other Ambulatory Visit (HOSPITAL_BASED_OUTPATIENT_CLINIC_OR_DEPARTMENT_OTHER): Payer: Self-pay

## 2023-04-15 MED ORDER — SERTRALINE HCL 100 MG PO TABS
150.0000 mg | ORAL_TABLET | Freq: Every day | ORAL | 1 refills | Status: AC
  Filled 2023-04-15: qty 45, 30d supply, fill #0
  Filled 2023-05-25 – 2024-03-16 (×2): qty 45, 30d supply, fill #1

## 2023-04-16 ENCOUNTER — Other Ambulatory Visit (HOSPITAL_BASED_OUTPATIENT_CLINIC_OR_DEPARTMENT_OTHER): Payer: Self-pay

## 2023-04-16 ENCOUNTER — Other Ambulatory Visit: Payer: Self-pay

## 2023-04-19 ENCOUNTER — Other Ambulatory Visit (HOSPITAL_BASED_OUTPATIENT_CLINIC_OR_DEPARTMENT_OTHER): Payer: Self-pay

## 2023-04-29 ENCOUNTER — Other Ambulatory Visit (HOSPITAL_BASED_OUTPATIENT_CLINIC_OR_DEPARTMENT_OTHER): Payer: Self-pay

## 2023-05-07 ENCOUNTER — Other Ambulatory Visit (HOSPITAL_BASED_OUTPATIENT_CLINIC_OR_DEPARTMENT_OTHER): Payer: Self-pay

## 2023-05-13 ENCOUNTER — Other Ambulatory Visit (HOSPITAL_BASED_OUTPATIENT_CLINIC_OR_DEPARTMENT_OTHER): Payer: Self-pay

## 2023-05-14 ENCOUNTER — Other Ambulatory Visit (HOSPITAL_BASED_OUTPATIENT_CLINIC_OR_DEPARTMENT_OTHER): Payer: Self-pay

## 2023-06-04 ENCOUNTER — Other Ambulatory Visit (HOSPITAL_BASED_OUTPATIENT_CLINIC_OR_DEPARTMENT_OTHER): Payer: Self-pay

## 2023-06-07 ENCOUNTER — Other Ambulatory Visit (HOSPITAL_BASED_OUTPATIENT_CLINIC_OR_DEPARTMENT_OTHER): Payer: Self-pay

## 2023-06-08 ENCOUNTER — Other Ambulatory Visit: Payer: Self-pay

## 2023-06-08 ENCOUNTER — Other Ambulatory Visit (HOSPITAL_BASED_OUTPATIENT_CLINIC_OR_DEPARTMENT_OTHER): Payer: Self-pay

## 2023-06-08 MED ORDER — CLONAZEPAM 0.5 MG PO TABS
0.5000 mg | ORAL_TABLET | Freq: Two times a day (BID) | ORAL | 5 refills | Status: AC
  Filled 2023-06-08: qty 60, 30d supply, fill #0
  Filled 2023-07-13: qty 60, 30d supply, fill #1

## 2023-07-13 ENCOUNTER — Other Ambulatory Visit (HOSPITAL_BASED_OUTPATIENT_CLINIC_OR_DEPARTMENT_OTHER): Payer: Self-pay

## 2023-07-14 ENCOUNTER — Other Ambulatory Visit: Payer: Self-pay

## 2023-07-14 ENCOUNTER — Other Ambulatory Visit (HOSPITAL_BASED_OUTPATIENT_CLINIC_OR_DEPARTMENT_OTHER): Payer: Self-pay

## 2023-07-14 DIAGNOSIS — F410 Panic disorder [episodic paroxysmal anxiety] without agoraphobia: Secondary | ICD-10-CM | POA: Diagnosis not present

## 2023-07-14 DIAGNOSIS — Z5181 Encounter for therapeutic drug level monitoring: Secondary | ICD-10-CM | POA: Diagnosis not present

## 2023-07-14 MED ORDER — CLONAZEPAM 0.5 MG PO TABS
0.5000 mg | ORAL_TABLET | Freq: Two times a day (BID) | ORAL | 5 refills | Status: DC
  Filled 2023-07-14 – 2023-08-12 (×2): qty 60, 30d supply, fill #0
  Filled 2023-09-11: qty 60, 30d supply, fill #1
  Filled 2023-10-09 – 2023-11-11 (×3): qty 60, 30d supply, fill #2
  Filled 2023-12-06 – 2023-12-09 (×3): qty 60, 30d supply, fill #3

## 2023-07-14 MED ORDER — SERTRALINE HCL 100 MG PO TABS
150.0000 mg | ORAL_TABLET | Freq: Every day | ORAL | 1 refills | Status: AC
  Filled 2023-07-14: qty 135, 90d supply, fill #0
  Filled 2023-12-03: qty 135, 90d supply, fill #1

## 2023-07-14 MED ORDER — BUPROPION HCL ER (XL) 150 MG PO TB24
150.0000 mg | ORAL_TABLET | Freq: Every morning | ORAL | 1 refills | Status: AC
Start: 2023-07-14 — End: ?
  Filled 2023-07-14: qty 90, 90d supply, fill #0
  Filled 2023-10-06: qty 90, 90d supply, fill #1

## 2023-08-13 ENCOUNTER — Other Ambulatory Visit (HOSPITAL_BASED_OUTPATIENT_CLINIC_OR_DEPARTMENT_OTHER): Payer: Self-pay

## 2023-09-13 ENCOUNTER — Other Ambulatory Visit: Payer: Self-pay

## 2023-09-13 ENCOUNTER — Other Ambulatory Visit (HOSPITAL_BASED_OUTPATIENT_CLINIC_OR_DEPARTMENT_OTHER): Payer: Self-pay

## 2023-09-29 NOTE — Progress Notes (Deleted)
 [facility] [address] [place] [address] Phone: [phone] Subjective:    I'm seeing this patient by the request  of:  Lucius Krabbe, NP  CC:   YEP:Dlagzrupcz  Michaela Gutierrez is a 58 y.o. female coming in with complaint of x pain. Patient states       Past Medical History:  Diagnosis Date   Anemia    past hx    Anxiety    Arthritis    OA hands    History of hysterectomy 01/29/2020   Past Surgical History:  Procedure Laterality Date   ABDOMINAL HYSTERECTOMY     Broken bone repair     CESAREAN SECTION     x2   Social History   Socioeconomic History   Marital status: Married    Spouse name: Not on file   Number of children: Not on file   Years of education: Not on file   Highest education level: Not on file  Occupational History    Employer: [employer]  Tobacco Use   Smoking status: Former    Types: Cigarettes   Smokeless tobacco: Never  Vaping Use   Vaping status: Never Used  Substance and Sexual Activity   Alcohol use: Not Currently    Alcohol/week: 1.0 standard drink of alcohol    Types: 1 Standard drinks or equivalent per week   Drug use: Never   Sexual activity: Yes    Partners: Male    Birth control/protection: None  Other Topics Concern   Not on file  Social History Narrative   Not on file   Social Drivers of Health   Financial Resource Strain: Low Risk  (12/17/2022)   Overall Financial Resource Strain (CARDIA)    Difficulty of Paying Living Expenses: Not hard at all  Food Insecurity: No Food Insecurity (12/17/2022)   Hunger Vital Sign    Worried About Running Out of Food in the Last Year: Never true    Ran Out of Food in the Last Year: Never true  Transportation Needs: No Transportation Needs (12/17/2022)   PRAPARE - Administrator, Civil Service (Medical): No    Lack of Transportation (Non-Medical): No  Physical Activity: Sufficiently Active (12/17/2022)   Exercise Vital Sign     Days of Exercise per Week: 3 days    Minutes of Exercise per Session: 50 min  Stress: No Stress Concern Present (12/17/2022)   [employer] of Occupational Health - Occupational Stress Questionnaire    Feeling of Stress : Only a little  Social Connections: Unknown (12/17/2022)   Social Connection and Isolation Panel    Frequency of Communication with Friends and Family: More than three times a week    Frequency of Social Gatherings with Friends and Family: Once a week    Attends Religious Services: Patient declined    Database administrator or Organizations: No    Attends Engineer, structural: Not on file    Marital Status: Married   No Known Allergies Family History  Problem Relation Age of Onset   Diabetes Mother    Depression Mother    Anxiety disorder Mother    Arthritis Mother    Hypertension Mother    Prostate cancer Paternal Grandfather    Colon polyps Neg Hx    Colon cancer Neg Hx    Esophageal cancer Neg Hx    Rectal cancer Neg Hx    Stomach cancer Neg Hx  Current Outpatient Medications (Endocrine & Metabolic):    dexamethasone  (DECADRON ) 4 MG tablet, Take 1 tablet (4 mg total) by mouth 3 (three) times daily as needed swelling    Current Outpatient Medications (Analgesics):    celecoxib  (CELEBREX ) 200 MG capsule, Take 1 capsule (200 mg total) by mouth daily.   Current Outpatient Medications (Other):    buPROPion  (WELLBUTRIN  XL) 150 MG 24 hr tablet, Take 1 tablet (150 mg total) by mouth in the morning.   buPROPion  (WELLBUTRIN  XL) 150 MG 24 hr tablet, Take 1 tablet (150 mg total) by mouth in the morning.   citalopram  (CELEXA ) 20 MG tablet, Take 0.5 tablets (10 mg total) by mouth daily at bedtime for 10 days, THEN 1 tablet (20 mg total) daily at bedtime   clonazePAM  (KLONOPIN ) 0.5 MG tablet, Take 1 tablet (0.5 mg total) by mouth 2 (two) times daily.   clonazePAM  (KLONOPIN ) 0.5 MG tablet, Take 1 tablet (0.5 mg total) by mouth 2 (two) times daily.    gabapentin  (NEURONTIN ) 300 MG capsule, Take 1 capsule (300 mg total) by mouth at bedtime.   Semaglutide -Weight Management (WEGOVY ) 1 MG/0.5ML SOAJ, Inject 1 mg into the skin once a week.   sertraline  (ZOLOFT ) 100 MG tablet, Take 1.5 tablets (150 mg total) by mouth in the morning.   sertraline  (ZOLOFT ) 100 MG tablet, Take 1.5 tablets (150 mg total) by mouth daily.   sertraline  (ZOLOFT ) 100 MG tablet, Take 1.5 tablets (150 mg total) by mouth daily.   vortioxetine  HBr (TRINTELLIX ) 10 MG TABS tablet, Take 1 tablet (10 mg total) by mouth daily.   Reviewed prior external information including notes and imaging from  primary care provider As well as notes that were available from care everywhere and other healthcare systems.  Past medical history, social, surgical and family history all reviewed in electronic medical record.  No pertanent information unless stated regarding to the chief complaint.   Review of Systems:  No headache, visual changes, nausea, vomiting, diarrhea, constipation, dizziness, abdominal pain, skin rash, fevers, chills, night sweats, weight loss, swollen lymph nodes, body aches, joint swelling, chest pain, shortness of breath, mood changes. POSITIVE muscle aches  Objective  There were no vitals taken for this visit.   General: No apparent distress alert and oriented x3 mood and affect normal, dressed appropriately.  HEENT: Pupils equal, extraocular movements intact  Respiratory: Patient's speak in full sentences and does not appear short of breath  Cardiovascular: No lower extremity edema, non tender, no erythema      Impression and Recommendations:

## 2023-09-30 ENCOUNTER — Ambulatory Visit: Admitting: Family Medicine

## 2023-10-09 ENCOUNTER — Other Ambulatory Visit (HOSPITAL_BASED_OUTPATIENT_CLINIC_OR_DEPARTMENT_OTHER): Payer: Self-pay

## 2023-10-13 ENCOUNTER — Other Ambulatory Visit (HOSPITAL_BASED_OUTPATIENT_CLINIC_OR_DEPARTMENT_OTHER): Payer: Self-pay

## 2023-10-14 ENCOUNTER — Other Ambulatory Visit (HOSPITAL_BASED_OUTPATIENT_CLINIC_OR_DEPARTMENT_OTHER): Payer: Self-pay

## 2023-11-10 ENCOUNTER — Other Ambulatory Visit: Payer: Self-pay

## 2023-11-10 ENCOUNTER — Other Ambulatory Visit (HOSPITAL_BASED_OUTPATIENT_CLINIC_OR_DEPARTMENT_OTHER): Payer: Self-pay

## 2023-11-10 NOTE — Progress Notes (Unsigned)
 [facility] [address] [place] [address] Phone: [phone] Subjective:   Michaela Gutierrez, am serving as a scribe for Dr. Arthea Claudene.  I'm seeing this patient by the request  of:  Lucius Krabbe, NP  CC: neck pain follow up   YEP:Dlagzrupcz  Michaela Gutierrez is a 58 y.o. female coming in with complaint of cervical spine pain. Patient states that she has had pain for years. 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 Numbness and tingling for past year that goes down to fingers. Wants to look at conservative treatments for her symptoms.   Also c/o L hip pain over GT. Numbness radiating from this area down to the lateral aspect of her knee. Symptoms are constant and started 6 months ago.   Also having L sided plantar fascia pain.   Patient did have an MRI of the cervical spine December 2024.  Show the patient did have degenerative disc as well as facet arthropathy at multiple levels but seems to be moderate spinal stenosis with severe left foraminal narrowing with stenosis at L3-L4.  Patient has seen neurosurgery in January of this year and discussed the potential for surgical intervention.  Past Medical History:  Diagnosis Date   Anemia    past hx    Anxiety    Arthritis    OA hands    History of hysterectomy 01/29/2020   Past Surgical History:  Procedure Laterality Date   ABDOMINAL HYSTERECTOMY     Broken bone repair     CESAREAN SECTION     x2   Social History   Socioeconomic History   Marital status: Married    Spouse name: Not on file   Number of children: Not on file   Years of education: Not on file   Highest education level: Not on file  Occupational History    Employer: [employer]  Tobacco Use   Smoking status: Former    Types: Cigarettes   Smokeless tobacco: Never  Vaping Use   Vaping status: Never Used  Substance and Sexual Activity   Alcohol use: Not Currently     Alcohol/week: 1.0 standard drink of alcohol    Types: 1 Standard drinks or equivalent per week   Drug use: Never   Sexual activity: Yes    Partners: Male    Birth control/protection: None  Other Topics Concern   Not on file  Social History Narrative   Not on file   Social Drivers of Health   Financial Resource Strain: Low Risk  (12/17/2022)   Overall Financial Resource Strain (CARDIA)    Difficulty of Paying Living Expenses: Not hard at all  Food Insecurity: No Food Insecurity (12/17/2022)   Hunger Vital Sign    Worried About Running Out of Food in the Last Year: Never true    Ran Out of Food in the Last Year: Never true  Transportation Needs: No Transportation Needs (12/17/2022)   PRAPARE - Administrator, Civil Service (Medical): No    Lack of Transportation (Non-Medical): No  Physical Activity: Sufficiently Active (12/17/2022)   Exercise Vital Sign    Days of Exercise per Week: 3 days    Minutes of Exercise per Session: 50 min  Stress: No Stress Concern Present (12/17/2022)   [employer] of Occupational Health - Occupational Stress Questionnaire    Feeling of Stress : Only a little  Social Connections: Unknown (  12/17/2022)   Social Connection and Isolation Panel    Frequency of Communication with Friends and Family: More than three times a week    Frequency of Social Gatherings with Friends and Family: Once a week    Attends Religious Services: Patient declined    Database administrator or Organizations: No    Attends Engineer, structural: Not on file    Marital Status: Married   No Known Allergies Family History  Problem Relation Age of Onset   Diabetes Mother    Depression Mother    Anxiety disorder Mother    Arthritis Mother    Hypertension Mother    Prostate cancer Paternal Grandfather    Colon polyps Neg Hx    Colon cancer Neg Hx    Esophageal cancer Neg Hx    Rectal cancer Neg Hx    Stomach cancer Neg Hx     Current  Outpatient Medications (Endocrine & Metabolic):    dexamethasone  (DECADRON ) 4 MG tablet, Take 1 tablet (4 mg total) by mouth 3 (three) times daily as needed swelling    Current Outpatient Medications (Analgesics):    celecoxib  (CELEBREX ) 200 MG capsule, Take 1 capsule (200 mg total) by mouth daily.   Current Outpatient Medications (Other):    buPROPion  (WELLBUTRIN  XL) 150 MG 24 hr tablet, Take 1 tablet (150 mg total) by mouth in the morning.   buPROPion  (WELLBUTRIN  XL) 150 MG 24 hr tablet, Take 1 tablet (150 mg total) by mouth in the morning.   clonazePAM  (KLONOPIN ) 0.5 MG tablet, Take 1 tablet (0.5 mg total) by mouth 2 (two) times daily.   clonazePAM  (KLONOPIN ) 0.5 MG tablet, Take 1 tablet (0.5 mg total) by mouth 2 (two) times daily.   gabapentin  (NEURONTIN ) 300 MG capsule, Take 1 capsule (300 mg total) by mouth at bedtime.   sertraline  (ZOLOFT ) 100 MG tablet, Take 1.5 tablets (150 mg total) by mouth in the morning.   sertraline  (ZOLOFT ) 100 MG tablet, Take 1.5 tablets (150 mg total) by mouth daily.   sertraline  (ZOLOFT ) 100 MG tablet, Take 1.5 tablets (150 mg total) by mouth daily.   citalopram  (CELEXA ) 20 MG tablet, Take 0.5 tablets (10 mg total) by mouth daily at bedtime for 10 days, THEN 1 tablet (20 mg total) daily at bedtime   Semaglutide -Weight Management (WEGOVY ) 1 MG/0.5ML SOAJ, Inject 1 mg into the skin once a week.   vortioxetine  HBr (TRINTELLIX ) 10 MG TABS tablet, Take 1 tablet (10 mg total) by mouth daily.   Reviewed prior external information including notes and imaging from  primary care provider As well as notes that were available from care everywhere and other healthcare systems.  Past medical history, social, surgical and family history all reviewed in electronic medical record.  No pertanent information unless stated regarding to the chief complaint.   Review of Systems:  No headache, visual changes, nausea, vomiting, diarrhea, constipation, dizziness, abdominal  pain, skin rash, fevers, chills, night sweats, weight loss, swollen lymph nodes, body aches, joint swelling, chest pain, shortness of breath, mood changes. POSITIVE muscle aches  Objective  Blood pressure 124/86, pulse 84, height 5' 4 (1.626 m), weight 192 lb (87.1 kg), SpO2 99%.   General: No apparent distress alert and oriented x3 mood and affect normal, dressed appropriately.  HEENT: Pupils equal, extraocular movements intact  Respiratory: Patient's speak in full sentences and does not appear short of breath  Cardiovascular: No lower extremity edema, non tender, no erythema  Neck exam shows loss  of lordosis noted.  Positive Spurling's with radicular symptoms on the left side.  Significant arthritic changes of the hands noted including the DIP    Impression and Recommendations:    The above documentation has been reviewed and is accurate and complete Micole Delehanty M Chaye Misch, DO

## 2023-11-11 ENCOUNTER — Ambulatory Visit: Admitting: Family Medicine

## 2023-11-11 ENCOUNTER — Other Ambulatory Visit (HOSPITAL_BASED_OUTPATIENT_CLINIC_OR_DEPARTMENT_OTHER): Payer: Self-pay

## 2023-11-11 ENCOUNTER — Ambulatory Visit: Payer: Self-pay | Admitting: Family Medicine

## 2023-11-11 ENCOUNTER — Other Ambulatory Visit: Payer: Self-pay

## 2023-11-11 VITALS — BP 124/86 | HR 84 | Ht 64.0 in | Wt 192.0 lb

## 2023-11-11 DIAGNOSIS — M5412 Radiculopathy, cervical region: Secondary | ICD-10-CM | POA: Diagnosis not present

## 2023-11-11 LAB — CBC WITH DIFFERENTIAL/PLATELET
Basophils Absolute: 0 K/uL (ref 0.0–0.1)
Basophils Relative: 0.4 % (ref 0.0–3.0)
Eosinophils Absolute: 0.1 K/uL (ref 0.0–0.7)
Eosinophils Relative: 2.2 % (ref 0.0–5.0)
HCT: 42.9 % (ref 36.0–46.0)
Hemoglobin: 14.3 g/dL (ref 12.0–15.0)
Lymphocytes Relative: 40.5 % (ref 12.0–46.0)
Lymphs Abs: 2.4 K/uL (ref 0.7–4.0)
MCHC: 33.3 g/dL (ref 30.0–36.0)
MCV: 83.9 fl (ref 78.0–100.0)
Monocytes Absolute: 0.4 K/uL (ref 0.1–1.0)
Monocytes Relative: 6.8 % (ref 3.0–12.0)
Neutro Abs: 3 K/uL (ref 1.4–7.7)
Neutrophils Relative %: 50.1 % (ref 43.0–77.0)
Platelets: 204 K/uL (ref 150.0–400.0)
RBC: 5.12 Mil/uL — ABNORMAL HIGH (ref 3.87–5.11)
RDW: 12.9 % (ref 11.5–15.5)
WBC: 5.9 K/uL (ref 4.0–10.5)

## 2023-11-11 LAB — TSH: TSH: 1.18 u[IU]/mL (ref 0.35–5.50)

## 2023-11-11 LAB — COMPREHENSIVE METABOLIC PANEL WITH GFR
ALT: 24 U/L (ref 0–35)
AST: 17 U/L (ref 0–37)
Albumin: 4.5 g/dL (ref 3.5–5.2)
Alkaline Phosphatase: 60 U/L (ref 39–117)
BUN: 18 mg/dL (ref 6–23)
CO2: 28 meq/L (ref 19–32)
Calcium: 9.9 mg/dL (ref 8.4–10.5)
Chloride: 102 meq/L (ref 96–112)
Creatinine, Ser: 0.71 mg/dL (ref 0.40–1.20)
GFR: 94.96 mL/min (ref >60.00)
Glucose, Bld: 89 mg/dL (ref 70–99)
Potassium: 4.1 meq/L (ref 3.5–5.1)
Sodium: 137 meq/L (ref 135–145)
Total Bilirubin: 0.4 mg/dL (ref 0.2–1.2)
Total Protein: 7.4 g/dL (ref 6.0–8.3)

## 2023-11-11 LAB — VITAMIN B12: Vitamin B-12: 326 pg/mL (ref 211–911)

## 2023-11-11 LAB — C-REACTIVE PROTEIN: CRP: <1 mg/dL (ref 0.5–20.0)

## 2023-11-11 LAB — VITAMIN D 25 HYDROXY (VIT D DEFICIENCY, FRACTURES): VITD: 28.6 ng/mL — ABNORMAL LOW (ref 30.00–100.00)

## 2023-11-11 LAB — SEDIMENTATION RATE: Sed Rate: 13 mm/h (ref 0–30)

## 2023-11-11 LAB — IBC PANEL
Iron: 101 ug/dL (ref 42–145)
Saturation Ratios: 26 % (ref 20.0–50.0)
TIBC: 387.8 ug/dL (ref 250.0–450.0)
Transferrin: 277 mg/dL (ref 212.0–360.0)

## 2023-11-11 LAB — URIC ACID: Uric Acid, Serum: 5.5 mg/dL (ref 2.4–7.0)

## 2023-11-11 LAB — FERRITIN: Ferritin: 45.3 ng/mL (ref 10.0–291.0)

## 2023-11-11 MED ORDER — VITAMIN D (ERGOCALCIFEROL) 1.25 MG (50000 UNIT) PO CAPS
50000.0000 [IU] | ORAL_CAPSULE | ORAL | 0 refills | Status: AC
  Filled 2023-11-11: qty 12, 84d supply, fill #0

## 2023-11-11 NOTE — Patient Instructions (Addendum)
 [facility] [phone]  Labs today See you again in 5-6 weeks after epidural

## 2023-11-11 NOTE — Assessment & Plan Note (Addendum)
 Left-sided, corresponds with MRI findings of the C5-C6 distribution.  Discussed with patient that icing regimen and home exercise is otherwise.   patient has tried formal physical therapy but did not improve.  Unable to tolerate the gabapentin  well.  Would discuss potential Cymbalta.  Will hold on manipulation but is a possibility.  Refer patient to get epidural and C7-T1 which I think will be beneficial.  Patient is in agreement with the plan.  Has already seen neurosurgery and understands that surgical intervention is a possibility as well.

## 2023-11-14 LAB — PTH, INTACT AND CALCIUM
Calcium: 9.8 mg/dL (ref 8.6–10.4)
PTH: 38 pg/mL (ref 16–77)

## 2023-11-14 LAB — CALCIUM, IONIZED: Calcium, Ion: 5.2 mg/dL (ref 4.7–5.5)

## 2023-11-14 LAB — CYCLIC CITRUL PEPTIDE ANTIBODY, IGG: Cyclic Citrullin Peptide Ab: <16 U

## 2023-11-14 LAB — ANGIOTENSIN CONVERTING ENZYME: Angiotensin-Converting Enzyme: 55 U/L (ref 9–67)

## 2023-11-14 LAB — ANA: Anti Nuclear Antibody (ANA): NEGATIVE

## 2023-11-14 LAB — RHEUMATOID FACTOR: Rheumatoid fact SerPl-aCnc: <10 [IU]/mL (ref <14)

## 2023-11-18 ENCOUNTER — Encounter: Admitting: Family

## 2023-11-19 ENCOUNTER — Encounter: Admitting: Family

## 2023-11-30 NOTE — Discharge Instructions (Signed)

## 2023-12-02 ENCOUNTER — Ambulatory Visit
Admission: RE | Admit: 2023-12-02 | Discharge: 2023-12-02 | Disposition: A | Discharge status: Home / Self Care | Source: Ambulatory Visit | Attending: Family Medicine | Admitting: Family Medicine

## 2023-12-02 DIAGNOSIS — M542 Cervicalgia: Secondary | ICD-10-CM | POA: Diagnosis not present

## 2023-12-02 DIAGNOSIS — M5412 Radiculopathy, cervical region: Secondary | ICD-10-CM

## 2023-12-02 MED ORDER — TRIAMCINOLONE ACETONIDE 40 MG/ML IJ SUSP (RADIOLOGY)
60.0000 mg | Freq: Once | INTRAMUSCULAR | Status: AC
  Administered 2023-12-02: 60 mg via EPIDURAL

## 2023-12-02 MED ORDER — IOPAMIDOL (ISOVUE-M 300) INJECTION 61%
1.0000 mL | Freq: Once | INTRAMUSCULAR | Status: AC | PRN
  Administered 2023-12-02: 1 mL via EPIDURAL

## 2023-12-03 ENCOUNTER — Other Ambulatory Visit (HOSPITAL_BASED_OUTPATIENT_CLINIC_OR_DEPARTMENT_OTHER): Payer: Self-pay

## 2023-12-07 ENCOUNTER — Other Ambulatory Visit: Payer: Self-pay

## 2023-12-07 ENCOUNTER — Other Ambulatory Visit (HOSPITAL_BASED_OUTPATIENT_CLINIC_OR_DEPARTMENT_OTHER): Payer: Self-pay

## 2023-12-09 ENCOUNTER — Other Ambulatory Visit (HOSPITAL_COMMUNITY): Payer: Self-pay

## 2023-12-09 ENCOUNTER — Other Ambulatory Visit (HOSPITAL_BASED_OUTPATIENT_CLINIC_OR_DEPARTMENT_OTHER): Payer: Self-pay

## 2023-12-12 ENCOUNTER — Encounter: Payer: Self-pay | Admitting: Family Medicine

## 2023-12-23 ENCOUNTER — Other Ambulatory Visit (HOSPITAL_BASED_OUTPATIENT_CLINIC_OR_DEPARTMENT_OTHER): Payer: Self-pay

## 2023-12-23 ENCOUNTER — Other Ambulatory Visit: Payer: Self-pay | Admitting: Medical Genetics

## 2023-12-23 DIAGNOSIS — Z006 Encounter for examination for normal comparison and control in clinical research program: Secondary | ICD-10-CM

## 2023-12-27 ENCOUNTER — Other Ambulatory Visit (HOSPITAL_BASED_OUTPATIENT_CLINIC_OR_DEPARTMENT_OTHER): Payer: Self-pay

## 2023-12-27 DIAGNOSIS — R635 Abnormal weight gain: Secondary | ICD-10-CM | POA: Diagnosis not present

## 2023-12-27 DIAGNOSIS — R7303 Prediabetes: Secondary | ICD-10-CM | POA: Diagnosis not present

## 2023-12-27 DIAGNOSIS — R232 Flushing: Secondary | ICD-10-CM | POA: Diagnosis not present

## 2023-12-27 DIAGNOSIS — E559 Vitamin D deficiency, unspecified: Secondary | ICD-10-CM | POA: Diagnosis not present

## 2023-12-27 DIAGNOSIS — F419 Anxiety disorder, unspecified: Secondary | ICD-10-CM | POA: Diagnosis not present

## 2023-12-27 DIAGNOSIS — N951 Menopausal and female climacteric states: Secondary | ICD-10-CM | POA: Diagnosis not present

## 2023-12-27 DIAGNOSIS — E785 Hyperlipidemia, unspecified: Secondary | ICD-10-CM | POA: Diagnosis not present

## 2023-12-27 DIAGNOSIS — Z1331 Encounter for screening for depression: Secondary | ICD-10-CM | POA: Diagnosis not present

## 2023-12-27 DIAGNOSIS — Z6832 Body mass index (BMI) 32.0-32.9, adult: Secondary | ICD-10-CM | POA: Diagnosis not present

## 2023-12-27 DIAGNOSIS — Z1339 Encounter for screening examination for other mental health and behavioral disorders: Secondary | ICD-10-CM | POA: Diagnosis not present

## 2023-12-27 MED ORDER — PROGESTERONE MICRONIZED 100 MG PO CAPS
100.0000 mg | ORAL_CAPSULE | Freq: Every day | ORAL | 1 refills | Status: AC
Start: 2023-12-27 — End: ?
  Filled 2023-12-27: qty 60, 30d supply, fill #0

## 2023-12-27 MED ORDER — ESTRADIOL 0.05 MG/24HR TD PTTW
1.0000 | MEDICATED_PATCH | TRANSDERMAL | 1 refills | Status: AC
  Filled 2023-12-27: qty 8, 28d supply, fill #0
  Filled 2024-01-20: qty 8, 28d supply, fill #1

## 2024-01-05 DIAGNOSIS — F419 Anxiety disorder, unspecified: Secondary | ICD-10-CM | POA: Diagnosis not present

## 2024-01-05 DIAGNOSIS — Z6833 Body mass index (BMI) 33.0-33.9, adult: Secondary | ICD-10-CM | POA: Diagnosis not present

## 2024-01-11 DIAGNOSIS — Z6832 Body mass index (BMI) 32.0-32.9, adult: Secondary | ICD-10-CM | POA: Diagnosis not present

## 2024-01-11 DIAGNOSIS — F419 Anxiety disorder, unspecified: Secondary | ICD-10-CM | POA: Diagnosis not present

## 2024-01-17 ENCOUNTER — Other Ambulatory Visit: Payer: Self-pay

## 2024-01-17 ENCOUNTER — Other Ambulatory Visit (HOSPITAL_BASED_OUTPATIENT_CLINIC_OR_DEPARTMENT_OTHER): Payer: Self-pay

## 2024-01-17 MED ORDER — SERTRALINE HCL 100 MG PO TABS: 150.0000 mg | ORAL_TABLET | Freq: Every day | ORAL | 1 refills | Status: AC

## 2024-01-17 MED ORDER — CLONAZEPAM 0.5 MG PO TABS
0.5000 mg | ORAL_TABLET | Freq: Two times a day (BID) | ORAL | 2 refills | Status: AC
  Filled 2024-01-17: qty 60, 30d supply, fill #0
  Filled 2024-02-21: qty 60, 30d supply, fill #1
  Filled 2024-03-21: qty 60, 30d supply, fill #2

## 2024-01-17 MED ORDER — BUPROPION HCL ER (XL) 150 MG PO TB24: 150.0000 mg | ORAL_TABLET | Freq: Every morning | ORAL | 1 refills | Status: AC

## 2024-01-19 DIAGNOSIS — Z6832 Body mass index (BMI) 32.0-32.9, adult: Secondary | ICD-10-CM | POA: Diagnosis not present

## 2024-01-19 DIAGNOSIS — R7303 Prediabetes: Secondary | ICD-10-CM | POA: Diagnosis not present

## 2024-01-26 ENCOUNTER — Other Ambulatory Visit: Payer: Self-pay

## 2024-01-26 ENCOUNTER — Other Ambulatory Visit (HOSPITAL_BASED_OUTPATIENT_CLINIC_OR_DEPARTMENT_OTHER): Payer: Self-pay

## 2024-01-26 DIAGNOSIS — R232 Flushing: Secondary | ICD-10-CM | POA: Diagnosis not present

## 2024-01-26 DIAGNOSIS — Z79890 Hormone replacement therapy (postmenopausal): Secondary | ICD-10-CM | POA: Diagnosis not present

## 2024-01-26 DIAGNOSIS — Z6832 Body mass index (BMI) 32.0-32.9, adult: Secondary | ICD-10-CM | POA: Diagnosis not present

## 2024-01-26 DIAGNOSIS — E559 Vitamin D deficiency, unspecified: Secondary | ICD-10-CM | POA: Diagnosis not present

## 2024-01-26 DIAGNOSIS — N951 Menopausal and female climacteric states: Secondary | ICD-10-CM | POA: Diagnosis not present

## 2024-01-26 DIAGNOSIS — M1990 Unspecified osteoarthritis, unspecified site: Secondary | ICD-10-CM | POA: Diagnosis not present

## 2024-01-26 MED ORDER — PROGESTERONE MICRONIZED 100 MG PO CAPS
100.0000 mg | ORAL_CAPSULE | Freq: Every day | ORAL | 1 refills | Status: AC
  Filled 2024-01-26 – 2024-02-22 (×2): qty 180, 90d supply, fill #0

## 2024-02-05 ENCOUNTER — Other Ambulatory Visit (HOSPITAL_BASED_OUTPATIENT_CLINIC_OR_DEPARTMENT_OTHER): Payer: Self-pay

## 2024-02-21 ENCOUNTER — Other Ambulatory Visit (HOSPITAL_BASED_OUTPATIENT_CLINIC_OR_DEPARTMENT_OTHER): Payer: Self-pay

## 2024-02-21 ENCOUNTER — Other Ambulatory Visit (HOSPITAL_COMMUNITY): Payer: Self-pay

## 2024-02-21 NOTE — Progress Notes (Unsigned)
 " [facility] [address] [place] [address] Phone: [phone] Subjective:   Michaela Gutierrez, am serving as a scribe for Dr. Arthea Claudene.  I'm seeing this patient by the request  of:  Lucius Krabbe, NP  CC: Neck pain follow-up  YEP:Dlagzrupcz  11/11/2023 Left-sided, corresponds with MRI findings of the C5-C6 distribution.  Discussed with patient that icing regimen and home exercise is otherwise.   patient has tried formal physical therapy but did not improve.  Unable to tolerate the gabapentin  well.  Would discuss potential Cymbalta.  Will hold on manipulation but is a possibility.  Refer patient to get epidural and C7-T1 which I think will be beneficial.  Patient is in agreement with the plan.  Has already seen neurosurgery and understands that surgical intervention is a possibility as well.      Update 02/22/2024 Michaela Gutierrez is a 58 y.o. female coming in with complaint of cervical spine pain. Patient states epidural worked well. In the last 2 weeks has started to feel numbness and pain again. Located more in shoulder this time around. Feels like when holding arm a certain way she has to pop shoulder. (Bicep tendon)   Had an epidural in the cervical spine on October 9.  Past Medical History:  Diagnosis Date   Anemia    past hx    Anxiety    Arthritis    OA hands    History of hysterectomy 01/29/2020   Past Surgical History:  Procedure Laterality Date   ABDOMINAL HYSTERECTOMY     Broken bone repair     CESAREAN SECTION     x2   Social History   Socioeconomic History   Marital status: Married    Spouse name: Not on file   Number of children: Not on file   Years of education: Not on file   Highest education level: Not on file  Occupational History    Employer: [employer]  Tobacco Use   Smoking status: Former    Types: Cigarettes   Smokeless tobacco: Never  Vaping Use   Vaping status: Never Used  Substance and Sexual  Activity   Alcohol use: Not Currently    Alcohol/week: 1.0 standard drink of alcohol    Types: 1 Standard drinks or equivalent per week   Drug use: Never   Sexual activity: Yes    Partners: Male    Birth control/protection: None  Other Topics Concern   Not on file  Social History Narrative   Not on file   Social Drivers of Health   Tobacco Use: Medium Risk (01/17/2023)   Patient History    Smoking Tobacco Use: Former    Smokeless Tobacco Use: Never    Passive Exposure: Not on Actuary Strain: Low Risk (12/17/2022)   Overall Financial Resource Strain (CARDIA)    Difficulty of Paying Living Expenses: Not hard at all  Food Insecurity: No Food Insecurity (12/17/2022)   Hunger Vital Sign    Worried About Running Out of Food in the Last Year: Never true    Ran Out of Food in the Last Year: Never true  Transportation Needs: No Transportation Needs (12/17/2022)   PRAPARE - Administrator, Civil Service (Medical): No    Lack of Transportation (Non-Medical): No  Physical Activity: Sufficiently Active (12/17/2022)   Exercise Vital Sign    Days of Exercise per Week: 3 days    Minutes of Exercise per Session:  50 min  Stress: No Stress Concern Present (12/17/2022)   [employer] of Occupational Health - Occupational Stress Questionnaire    Feeling of Stress : Only a little  Social Connections: Unknown (12/17/2022)   Social Connection and Isolation Panel    Frequency of Communication with Friends and Family: More than three times a week    Frequency of Social Gatherings with Friends and Family: Once a week    Attends Religious Services: Patient declined    Database Administrator or Organizations: No    Attends Engineer, Structural: Not on file    Marital Status: Married  Depression (EYV7-0): Not on file  Alcohol Screen: Low Risk (12/17/2022)   Alcohol Screen    Last Alcohol Screening Score (AUDIT): 1  Housing: Low Risk (12/17/2022)    Housing    Last Housing Risk Score: 0  Utilities: Not on file  Health Literacy: Not on file   Allergies[1] Family History  Problem Relation Age of Onset   Diabetes Mother    Depression Mother    Anxiety disorder Mother    Arthritis Mother    Hypertension Mother    Prostate cancer Paternal Grandfather    Colon polyps Neg Hx    Colon cancer Neg Hx    Esophageal cancer Neg Hx    Rectal cancer Neg Hx    Stomach cancer Neg Hx     Current Outpatient Medications (Endocrine & Metabolic):    dexamethasone  (DECADRON ) 4 MG tablet, Take 1 tablet (4 mg total) by mouth 3 (three) times daily as needed swelling   estradiol  (VIVELLE -DOT) 0.05 MG/24HR patch, Place 1 patch (0.05 mg total) onto the skin 2 (two) times a week.   progesterone  (PROMETRIUM ) 100 MG capsule, Take 1-2 capsules (100-200 mg total) by mouth at bedtime.   progesterone  (PROMETRIUM ) 100 MG capsule, Take 1-2 capsules (100-200 mg total) by mouth at bedtime.  Current Outpatient Medications (Analgesics):    celecoxib  (CELEBREX ) 200 MG capsule, Take 1 capsule (200 mg total) by mouth daily.  Current Outpatient Medications (Other):    Vitamin D , Ergocalciferol , (DRISDOL ) 1.25 MG (50000 UNIT) CAPS capsule, Take 1 capsule (50,000 Units total) by mouth every 7 (seven) days.   buPROPion  (WELLBUTRIN  XL) 150 MG 24 hr tablet, Take 1 tablet (150 mg total) by mouth in the morning.   buPROPion  (WELLBUTRIN  XL) 150 MG 24 hr tablet, Take 1 tablet (150 mg total) by mouth in the morning.   citalopram  (CELEXA ) 20 MG tablet, Take 0.5 tablets (10 mg total) by mouth daily at bedtime for 10 days, THEN 1 tablet (20 mg total) daily at bedtime   clonazePAM  (KLONOPIN ) 0.5 MG tablet, Take 1 tablet (0.5 mg total) by mouth 2 (two) times daily.   clonazePAM  (KLONOPIN ) 0.5 MG tablet, Take 1 tablet (0.5 mg total) by mouth 2 (two) times daily.   gabapentin  (NEURONTIN ) 300 MG capsule, Take 1 capsule (300 mg total) by mouth at bedtime.   Semaglutide -Weight Management  (WEGOVY ) 1 MG/0.5ML SOAJ, Inject 1 mg into the skin once a week.   sertraline  (ZOLOFT ) 100 MG tablet, Take 1.5 tablets (150 mg total) by mouth in the morning.   sertraline  (ZOLOFT ) 100 MG tablet, Take 1.5 tablets (150 mg total) by mouth daily.   sertraline  (ZOLOFT ) 100 MG tablet, Take 1.5 tablets (150 mg total) by mouth daily.   sertraline  (ZOLOFT ) 100 MG tablet, Take 1.5 tablets (150 mg total) by mouth daily.   vortioxetine  HBr (TRINTELLIX ) 10 MG TABS tablet, Take 1  tablet (10 mg total) by mouth daily.   Reviewed prior external information including notes and imaging from  primary care provider As well as notes that were available from care everywhere and other healthcare systems.  Past medical history, social, surgical and family history all reviewed in electronic medical record.  No pertanent information unless stated regarding to the chief complaint.   Review of Systems:  No headache, visual changes, nausea, vomiting, diarrhea, constipation, dizziness, abdominal pain, skin rash, fevers, chills, night sweats, weight loss, swollen lymph nodes, body aches, joint swelling, chest pain, shortness of breath, mood changes. POSITIVE muscle aches  Objective  Blood pressure 110/76, pulse (!) 103, height 5' 4 (1.626 m), weight 205 lb 9.6 oz (93.3 kg), SpO2 95%.   General: No apparent distress alert and oriented x3 mood and affect normal, dressed appropriately.  HEENT: Pupils equal, extraocular movements intact  Respiratory: Patient's speak in full sentences and does not appear short of breath  Cardiovascular: No lower extremity edema, non tender, no erythema  Neck exam does have some mild loss of lordosis noted.  Patient is sitting relatively comfortably but still has some difficulty with extension and sidebending to the left of the neck. Left shoulder does show positive Jurgenson sign.  Tender on the anterior aspect of the shoulder.  Rotator cuff strength 5 out of 5 and symmetric to the  contralateral side   Impression and Recommendations:     The above documentation has been reviewed and is accurate and complete Kaoru Benda M Tyric Rodeheaver, DO       [1] No Known Allergies  "

## 2024-02-22 ENCOUNTER — Other Ambulatory Visit (HOSPITAL_BASED_OUTPATIENT_CLINIC_OR_DEPARTMENT_OTHER): Payer: Self-pay

## 2024-02-22 ENCOUNTER — Ambulatory Visit: Admitting: Family Medicine

## 2024-02-22 VITALS — BP 110/76 | HR 103 | Ht 64.0 in | Wt 205.6 lb

## 2024-02-22 DIAGNOSIS — M67912 Unspecified disorder of synovium and tendon, left shoulder: Secondary | ICD-10-CM | POA: Diagnosis not present

## 2024-02-22 DIAGNOSIS — M5412 Radiculopathy, cervical region: Secondary | ICD-10-CM

## 2024-02-22 MED ORDER — WEGOVY 1 MG/0.5ML ~~LOC~~ SOAJ
1.0000 mg | SUBCUTANEOUS | 0 refills | Status: AC
  Filled 2024-02-22: qty 2, 28d supply, fill #0

## 2024-02-22 NOTE — Patient Instructions (Addendum)
 Arm compression sleeve Use back when moving patients [facility] [phone] for epidural See you again in 3-4 months

## 2024-02-22 NOTE — Assessment & Plan Note (Signed)
 Do think that this is potentially contributing.  Patient has felt very good and only a mild tingling has started to return.  Would like to order the possibility of another epidural to see how patient would respond.  Discussed with patient about icing regimen and home exercises, discussed which activities to do and which ones to avoid.  Follow-up with me again 2 months after the injection to see how patient responds

## 2024-02-22 NOTE — Assessment & Plan Note (Signed)
 History of tendinopathy but I believe the patient has more of a biceps subluxation occurring.  Given the compression sleeve we encouraged, discussed which activities to do and which ones to avoid.  Increase activity slowly.  Discussed icing regimen.  Follow-up again in 6 to 8 weeks

## 2024-02-23 ENCOUNTER — Other Ambulatory Visit (HOSPITAL_BASED_OUTPATIENT_CLINIC_OR_DEPARTMENT_OTHER): Payer: Self-pay

## 2024-02-29 ENCOUNTER — Other Ambulatory Visit (HOSPITAL_BASED_OUTPATIENT_CLINIC_OR_DEPARTMENT_OTHER): Payer: Self-pay

## 2024-03-01 ENCOUNTER — Other Ambulatory Visit (HOSPITAL_COMMUNITY): Payer: Self-pay | Admitting: Nurse Practitioner

## 2024-03-01 ENCOUNTER — Ambulatory Visit (HOSPITAL_COMMUNITY)
Admission: RE | Admit: 2024-03-01 | Discharge: 2024-03-01 | Disposition: A | Discharge status: Home / Self Care | Payer: Self-pay | Source: Ambulatory Visit | Attending: Nurse Practitioner | Admitting: Nurse Practitioner

## 2024-03-01 DIAGNOSIS — M25512 Pain in left shoulder: Secondary | ICD-10-CM

## 2024-03-02 ENCOUNTER — Other Ambulatory Visit (HOSPITAL_BASED_OUTPATIENT_CLINIC_OR_DEPARTMENT_OTHER): Payer: Self-pay

## 2024-03-03 ENCOUNTER — Other Ambulatory Visit (HOSPITAL_BASED_OUTPATIENT_CLINIC_OR_DEPARTMENT_OTHER): Payer: Self-pay

## 2024-03-04 ENCOUNTER — Other Ambulatory Visit (HOSPITAL_BASED_OUTPATIENT_CLINIC_OR_DEPARTMENT_OTHER): Payer: Self-pay

## 2024-03-06 ENCOUNTER — Other Ambulatory Visit (HOSPITAL_BASED_OUTPATIENT_CLINIC_OR_DEPARTMENT_OTHER): Payer: Self-pay

## 2024-03-06 MED ORDER — CELECOXIB 200 MG PO CAPS
200.0000 mg | ORAL_CAPSULE | Freq: Every day | ORAL | 2 refills | Status: AC
  Filled 2024-03-06: qty 90, 90d supply, fill #0

## 2024-03-07 ENCOUNTER — Other Ambulatory Visit (HOSPITAL_BASED_OUTPATIENT_CLINIC_OR_DEPARTMENT_OTHER): Payer: Self-pay

## 2024-03-07 NOTE — Discharge Instructions (Signed)

## 2024-03-08 ENCOUNTER — Inpatient Hospital Stay
Admission: RE | Admit: 2024-03-08 | Discharge: 2024-03-08 | Disposition: A | Discharge status: Home / Self Care | Source: Ambulatory Visit | Attending: Family Medicine | Admitting: Family Medicine

## 2024-03-16 ENCOUNTER — Other Ambulatory Visit: Payer: Self-pay

## 2024-03-16 ENCOUNTER — Other Ambulatory Visit: Payer: Self-pay | Admitting: Family Medicine

## 2024-03-16 NOTE — Telephone Encounter (Signed)
 overdue for OV, please schedule

## 2024-03-18 ENCOUNTER — Other Ambulatory Visit (HOSPITAL_BASED_OUTPATIENT_CLINIC_OR_DEPARTMENT_OTHER): Payer: Self-pay

## 2024-03-21 ENCOUNTER — Other Ambulatory Visit: Payer: Self-pay | Admitting: Family Medicine

## 2024-03-21 ENCOUNTER — Other Ambulatory Visit: Payer: Self-pay

## 2024-03-22 ENCOUNTER — Other Ambulatory Visit (HOSPITAL_BASED_OUTPATIENT_CLINIC_OR_DEPARTMENT_OTHER): Payer: Self-pay

## 2024-03-25 ENCOUNTER — Other Ambulatory Visit (HOSPITAL_BASED_OUTPATIENT_CLINIC_OR_DEPARTMENT_OTHER): Payer: Self-pay

## 2024-03-28 ENCOUNTER — Other Ambulatory Visit (HOSPITAL_BASED_OUTPATIENT_CLINIC_OR_DEPARTMENT_OTHER): Payer: Self-pay

## 2024-03-28 ENCOUNTER — Other Ambulatory Visit: Payer: Self-pay

## 2024-03-28 MED ORDER — OSELTAMIVIR PHOSPHATE 75 MG PO CAPS
75.0000 mg | ORAL_CAPSULE | Freq: Two times a day (BID) | ORAL | 0 refills | Status: AC
  Filled 2024-03-28: qty 10, 5d supply, fill #0

## 2024-05-31 ENCOUNTER — Ambulatory Visit: Admitting: Family Medicine
